# Patient Record
Sex: Male | Born: 1960
Health system: Southern US, Community
[De-identification: ages and names within clinical notes are randomized; demographics above are authoritative.]

## PROBLEM LIST (undated history)

## (undated) DIAGNOSIS — C801 Malignant (primary) neoplasm, unspecified: Secondary | ICD-10-CM

## (undated) DIAGNOSIS — M818 Other osteoporosis without current pathological fracture: Secondary | ICD-10-CM

## (undated) DIAGNOSIS — Z800 Family history of malignant neoplasm of digestive organs: Secondary | ICD-10-CM

## (undated) DIAGNOSIS — M1990 Unspecified osteoarthritis, unspecified site: Secondary | ICD-10-CM

## (undated) DIAGNOSIS — I10 Essential (primary) hypertension: Secondary | ICD-10-CM

## (undated) DIAGNOSIS — Z8042 Family history of malignant neoplasm of prostate: Secondary | ICD-10-CM

## (undated) DIAGNOSIS — Z806 Family history of leukemia: Secondary | ICD-10-CM

## (undated) DIAGNOSIS — N189 Chronic kidney disease, unspecified: Secondary | ICD-10-CM

## (undated) DIAGNOSIS — C50929 Malignant neoplasm of unspecified site of unspecified male breast: Secondary | ICD-10-CM

## (undated) HISTORY — DX: Chronic kidney disease, unspecified: N18.9

## (undated) HISTORY — PX: COLONOSCOPY: SHX174

## (undated) HISTORY — DX: Essential (primary) hypertension: I10

## (undated) HISTORY — DX: Family history of leukemia: Z80.6

## (undated) HISTORY — DX: Malignant neoplasm of unspecified site of unspecified male breast: C50.929

## (undated) HISTORY — PX: BREAST SURGERY: SHX581

## (undated) HISTORY — DX: Malignant (primary) neoplasm, unspecified: C80.1

## (undated) HISTORY — DX: Family history of malignant neoplasm of prostate: Z80.42

## (undated) HISTORY — DX: Family history of malignant neoplasm of digestive organs: Z80.0

## (undated) HISTORY — DX: Other osteoporosis without current pathological fracture: M81.8

---
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
## 2008-12-09 ENCOUNTER — Emergency Department (HOSPITAL_COMMUNITY): Admission: EM | Admit: 2008-12-09 | Discharge: 2008-12-09 | Payer: Self-pay | Admitting: Emergency Medicine

## 2009-01-07 ENCOUNTER — Emergency Department (HOSPITAL_COMMUNITY): Admission: EM | Admit: 2009-01-07 | Discharge: 2009-01-07 | Payer: Self-pay | Admitting: Emergency Medicine

## 2009-04-20 HISTORY — PX: PORT-A-CATH REMOVAL: SHX5289

## 2009-04-20 HISTORY — PX: OTHER SURGICAL HISTORY: SHX169

## 2009-10-08 ENCOUNTER — Ambulatory Visit: Payer: Self-pay | Admitting: Genetic Counselor

## 2009-11-05 ENCOUNTER — Ambulatory Visit (HOSPITAL_COMMUNITY): Admission: RE | Admit: 2009-11-05 | Discharge: 2009-11-05 | Payer: Self-pay | Admitting: General Surgery

## 2009-11-07 ENCOUNTER — Ambulatory Visit: Payer: Self-pay | Admitting: Hematology & Oncology

## 2009-11-22 LAB — CBC WITH DIFFERENTIAL (CANCER CENTER ONLY)
BASO%: 0.8 % (ref 0.0–2.0)
Eosinophils Absolute: 0.2 10*3/uL (ref 0.0–0.5)
HGB: 15.4 g/dL (ref 13.0–17.1)
MCH: 30.9 pg (ref 28.0–33.4)
MONO#: 0.5 10*3/uL (ref 0.1–0.9)
MONO%: 7.5 % (ref 0.0–13.0)
NEUT#: 3.3 10*3/uL (ref 1.5–6.5)
Platelets: 242 10*3/uL (ref 145–400)
RBC: 5 10*6/uL (ref 4.20–5.70)

## 2009-11-23 LAB — VITAMIN D 25 HYDROXY (VIT D DEFICIENCY, FRACTURES): Vit D, 25-Hydroxy: 27 ng/mL — ABNORMAL LOW (ref 30–89)

## 2009-11-23 LAB — COMPREHENSIVE METABOLIC PANEL
AST: 26 U/L (ref 0–37)
Alkaline Phosphatase: 91 U/L (ref 39–117)
BUN: 14 mg/dL (ref 6–23)
CO2: 27 mEq/L (ref 19–32)
Calcium: 9.3 mg/dL (ref 8.4–10.5)
Chloride: 103 mEq/L (ref 96–112)
Creatinine, Ser: 1.35 mg/dL (ref 0.40–1.50)
Glucose, Bld: 85 mg/dL (ref 70–99)
Potassium: 4.4 mEq/L (ref 3.5–5.3)
Sodium: 139 mEq/L (ref 135–145)
Total Protein: 6.6 g/dL (ref 6.0–8.3)

## 2009-11-28 ENCOUNTER — Telehealth (INDEPENDENT_AMBULATORY_CARE_PROVIDER_SITE_OTHER): Payer: Self-pay | Admitting: *Deleted

## 2009-12-02 ENCOUNTER — Ambulatory Visit: Payer: Self-pay

## 2009-12-02 ENCOUNTER — Encounter (HOSPITAL_COMMUNITY): Admission: RE | Admit: 2009-12-02 | Discharge: 2010-01-14 | Payer: Self-pay | Admitting: Hematology & Oncology

## 2009-12-02 ENCOUNTER — Ambulatory Visit: Payer: Self-pay | Admitting: Cardiovascular Disease

## 2009-12-03 ENCOUNTER — Ambulatory Visit (HOSPITAL_COMMUNITY): Admission: RE | Admit: 2009-12-03 | Discharge: 2009-12-03 | Payer: Self-pay | Admitting: Surgery

## 2009-12-19 ENCOUNTER — Ambulatory Visit: Payer: Self-pay | Admitting: Hematology & Oncology

## 2009-12-20 LAB — COMPREHENSIVE METABOLIC PANEL
ALT: 36 U/L (ref 0–53)
AST: 22 U/L (ref 0–37)
Albumin: 4.2 g/dL (ref 3.5–5.2)
Alkaline Phosphatase: 115 U/L (ref 39–117)
BUN: 16 mg/dL (ref 6–23)
CO2: 23 mEq/L (ref 19–32)
Calcium: 9.3 mg/dL (ref 8.4–10.5)
Chloride: 108 mEq/L (ref 96–112)
Creatinine, Ser: 1.21 mg/dL (ref 0.40–1.50)
Glucose, Bld: 108 mg/dL — ABNORMAL HIGH (ref 70–99)
Potassium: 4.2 mEq/L (ref 3.5–5.3)
Total Bilirubin: 0.4 mg/dL (ref 0.3–1.2)
Total Protein: 6.6 g/dL (ref 6.0–8.3)

## 2009-12-20 LAB — CBC WITH DIFFERENTIAL (CANCER CENTER ONLY)
EOS%: 2.6 % (ref 0.0–7.0)
HGB: 15.3 g/dL (ref 13.0–17.1)
LYMPH#: 2.1 10*3/uL (ref 0.9–3.3)
MCH: 29.8 pg (ref 28.0–33.4)
MCV: 87 fL (ref 82–98)
MONO#: 1 10*3/uL — ABNORMAL HIGH (ref 0.1–0.9)
MONO%: 11.6 % (ref 0.0–13.0)
NEUT%: 59.4 % (ref 40.0–80.0)
RDW: 10.8 % (ref 10.5–14.6)

## 2009-12-20 LAB — TECHNOLOGIST REVIEW CHCC SATELLITE

## 2009-12-21 ENCOUNTER — Ambulatory Visit: Payer: Self-pay | Admitting: Hematology & Oncology

## 2010-01-03 LAB — COMPREHENSIVE METABOLIC PANEL
ALT: 57 U/L — ABNORMAL HIGH (ref 0–53)
Calcium: 9.1 mg/dL (ref 8.4–10.5)
Creatinine, Ser: 1.2 mg/dL (ref 0.40–1.50)
Potassium: 4.4 mEq/L (ref 3.5–5.3)
Sodium: 140 mEq/L (ref 135–145)
Total Protein: 6.7 g/dL (ref 6.0–8.3)

## 2010-01-03 LAB — CBC WITH DIFFERENTIAL (CANCER CENTER ONLY)
EOS%: 1.9 % (ref 0.0–7.0)
LYMPH#: 1.8 10*3/uL (ref 0.9–3.3)
MCH: 30.1 pg (ref 28.0–33.4)
MONO#: 1.3 10*3/uL — ABNORMAL HIGH (ref 0.1–0.9)
NEUT#: 4.9 10*3/uL (ref 1.5–6.5)
NEUT%: 59.4 % (ref 40.0–80.0)
RBC: 4.72 10*6/uL (ref 4.20–5.70)

## 2010-01-17 ENCOUNTER — Ambulatory Visit: Payer: Self-pay | Admitting: Hematology & Oncology

## 2010-01-17 LAB — COMPREHENSIVE METABOLIC PANEL
BUN: 12 mg/dL (ref 6–23)
Calcium: 8.8 mg/dL (ref 8.4–10.5)
Chloride: 105 mEq/L (ref 96–112)
Glucose, Bld: 88 mg/dL (ref 70–99)
Potassium: 4 mEq/L (ref 3.5–5.3)
Total Bilirubin: 0.6 mg/dL (ref 0.3–1.2)
Total Protein: 6.4 g/dL (ref 6.0–8.3)

## 2010-01-17 LAB — CBC WITH DIFFERENTIAL (CANCER CENTER ONLY)
BASO#: 0.1 10*3/uL (ref 0.0–0.2)
BASO%: 0.8 % (ref 0.0–2.0)
EOS%: 1.3 % (ref 0.0–7.0)
HCT: 34.1 % — ABNORMAL LOW (ref 38.7–49.9)
HGB: 11.7 g/dL — ABNORMAL LOW (ref 13.0–17.1)
LYMPH#: 1.3 10*3/uL (ref 0.9–3.3)
MCH: 30.3 pg (ref 28.0–33.4)
MONO%: 15.6 % — ABNORMAL HIGH (ref 0.0–13.0)
NEUT#: 6.2 10*3/uL (ref 1.5–6.5)
Platelets: 183 10*3/uL (ref 145–400)
RDW: 11.7 % (ref 10.5–14.6)
WBC: 9.2 10*3/uL (ref 4.0–10.0)

## 2010-02-21 ENCOUNTER — Ambulatory Visit: Payer: Self-pay | Admitting: Hematology & Oncology

## 2010-02-21 LAB — CBC WITH DIFFERENTIAL (CANCER CENTER ONLY)
BASO#: 0 10*3/uL (ref 0.0–0.2)
EOS%: 3.3 % (ref 0.0–7.0)
Eosinophils Absolute: 0.1 10*3/uL (ref 0.0–0.5)
HCT: 36.2 % — ABNORMAL LOW (ref 38.7–49.9)
HGB: 12.7 g/dL — ABNORMAL LOW (ref 13.0–17.1)
LYMPH#: 1.1 10*3/uL (ref 0.9–3.3)
MCHC: 35 g/dL (ref 32.0–35.9)
MONO#: 0.5 10*3/uL (ref 0.1–0.9)
MONO%: 12.6 % (ref 0.0–13.0)
NEUT%: 56.4 % (ref 40.0–80.0)
Platelets: 184 10*3/uL (ref 145–400)
RBC: 3.91 10*6/uL — ABNORMAL LOW (ref 4.20–5.70)
WBC: 4.2 10*3/uL (ref 4.0–10.0)

## 2010-02-21 LAB — COMPREHENSIVE METABOLIC PANEL
ALT: 47 U/L (ref 0–53)
AST: 37 U/L (ref 0–37)
CO2: 24 mEq/L (ref 19–32)
Calcium: 8.9 mg/dL (ref 8.4–10.5)
Chloride: 105 mEq/L (ref 96–112)
Creatinine, Ser: 1.02 mg/dL (ref 0.40–1.50)
Glucose, Bld: 81 mg/dL (ref 70–99)
Sodium: 141 mEq/L (ref 135–145)

## 2010-04-03 ENCOUNTER — Ambulatory Visit: Payer: Self-pay | Admitting: Hematology & Oncology

## 2010-04-04 LAB — CBC WITH DIFFERENTIAL (CANCER CENTER ONLY)
MCHC: 34.5 g/dL (ref 32.0–35.9)
MCV: 92 fL (ref 82–98)
MONO#: 0.6 10*3/uL (ref 0.1–0.9)
NEUT#: 3 10*3/uL (ref 1.5–6.5)
NEUT%: 57.7 % (ref 40.0–80.0)
Platelets: 222 10*3/uL (ref 145–400)
RBC: 4.62 10*6/uL (ref 4.20–5.70)

## 2010-04-05 LAB — COMPREHENSIVE METABOLIC PANEL
ALT: 38 U/L (ref 0–53)
AST: 28 U/L (ref 0–37)
Alkaline Phosphatase: 93 U/L (ref 39–117)
BUN: 15 mg/dL (ref 6–23)
Calcium: 9.6 mg/dL (ref 8.4–10.5)
Potassium: 4.3 mEq/L (ref 3.5–5.3)
Sodium: 139 mEq/L (ref 135–145)
Total Protein: 7 g/dL (ref 6.0–8.3)

## 2010-05-22 NOTE — Progress Notes (Signed)
Summary: Nuc Pre-Procedure  Phone Note Outgoing Call Call back at [facility] Phone [phone]   Call placed by: Antionette Char RN,  November 28, 2009 4:59 PM Call placed to: Patient Reason for Call: Confirm/change Appt Summary of Call: Left message with information on Myoview Information Sheet (see scanned document for details).   New Allergies: ! PENICILLIN ! AMOXICILLIN New Allergies: ! PENICILLIN ! AMOXICILLIN

## 2010-05-22 NOTE — Assessment & Plan Note (Signed)
Summary: Cardiology Nuclear Testing    Muga Study  Indication: Evaluation of LVEF prior to chemotherapy for R breast cancer.  IV started in L hand with 20g by CHasspacher,RN  Muga Information: The patient's red blood cells were labeled using the Ultra Tag method with 33  mci of Technitium-52m Pertechnetate.  The images were reconstructed in the Anterior, Lateral and Left Anterior Oblique Views.  Impression: Images reviewed in the best septal, anterior and lateral projections.  LV cavity size and function were normal.  EF 57%  No RWMA's

## 2010-06-27 ENCOUNTER — Other Ambulatory Visit: Payer: Self-pay | Admitting: Hematology & Oncology

## 2010-06-27 ENCOUNTER — Encounter (HOSPITAL_BASED_OUTPATIENT_CLINIC_OR_DEPARTMENT_OTHER): Payer: 59 | Admitting: Hematology & Oncology

## 2010-06-27 DIAGNOSIS — C50029 Malignant neoplasm of nipple and areola, unspecified male breast: Secondary | ICD-10-CM

## 2010-06-27 LAB — CBC WITH DIFFERENTIAL (CANCER CENTER ONLY)
BASO#: 0.1 10*3/uL (ref 0.0–0.2)
BASO%: 0.9 % (ref 0.0–2.0)
EOS%: 4.4 % (ref 0.0–7.0)
Eosinophils Absolute: 0.3 10*3/uL (ref 0.0–0.5)
HCT: 40.6 % (ref 38.7–49.9)
HCT: 41.6 % (ref 38.7–49.9)
HGB: 15 g/dL (ref 13.0–17.1)
HGB: 15.2 g/dL (ref 13.0–17.1)
LYMPH%: 48.7 % — ABNORMAL HIGH (ref 14.0–48.0)
MCH: 30.2 pg (ref 28.0–33.4)
MCV: 82 fL (ref 82–98)
MCV: 81 fL — ABNORMAL LOW (ref 82–98)
MONO#: 1 10*3/uL — ABNORMAL HIGH (ref 0.1–0.9)
MONO%: 11.1 % (ref 0.0–13.0)
MONO%: 12.8 % (ref 0.0–13.0)
NEUT#: 3.2 10*3/uL (ref 1.5–6.5)
NEUT%: 54.2 % (ref 40.0–80.0)
Platelets: 175 10*3/uL (ref 145–400)
Platelets: 196 10*3/uL (ref 145–400)
RBC: 4.97 10*6/uL (ref 4.20–5.70)
RBC: 5.11 10*6/uL (ref 4.20–5.70)

## 2010-06-27 LAB — COMPREHENSIVE METABOLIC PANEL
AST: 41 U/L — ABNORMAL HIGH (ref 0–37)
Calcium: 9.3 mg/dL (ref 8.4–10.5)
Glucose, Bld: 91 mg/dL (ref 70–99)
Potassium: 4.3 mEq/L (ref 3.5–5.3)
Total Protein: 7 g/dL (ref 6.0–8.3)

## 2010-07-06 LAB — DIFFERENTIAL
Eosinophils Absolute: 0.2 10*3/uL (ref 0.0–0.7)
Eosinophils Relative: 3 % (ref 0–5)
Lymphocytes Relative: 27 % (ref 12–46)
Lymphs Abs: 1.9 10*3/uL (ref 0.7–4.0)

## 2010-07-06 LAB — COMPREHENSIVE METABOLIC PANEL
ALT: 50 U/L (ref 0–53)
BUN: 13 mg/dL (ref 6–23)
CO2: 27 mEq/L (ref 19–32)
Chloride: 104 mEq/L (ref 96–112)
Creatinine, Ser: 1.36 mg/dL (ref 0.4–1.5)
GFR calc Af Amer: >60 mL/min (ref >60)
GFR calc non Af Amer: 56 mL/min — ABNORMAL LOW (ref >60)
Glucose, Bld: 112 mg/dL — ABNORMAL HIGH (ref 70–99)
Potassium: 4.2 mEq/L (ref 3.5–5.1)
Sodium: 138 mEq/L (ref 135–145)
Total Bilirubin: 1 mg/dL (ref 0.3–1.2)
Total Protein: 6.6 g/dL (ref 6.0–8.3)

## 2010-07-06 LAB — URINALYSIS, ROUTINE W REFLEX MICROSCOPIC
Bilirubin Urine: NEGATIVE
Glucose, UA: NEGATIVE mg/dL
Protein, ur: NEGATIVE mg/dL
Specific Gravity, Urine: 1.024 (ref 1.005–1.030)
Urobilinogen, UA: 1 mg/dL (ref 0.0–1.0)
pH: 6 (ref 5.0–8.0)

## 2010-07-06 LAB — CBC
MCHC: 34.8 g/dL (ref 30.0–36.0)
MCV: 88.3 fL (ref 78.0–100.0)
Platelets: 216 10*3/uL (ref 150–400)
RBC: 5.04 MIL/uL (ref 4.22–5.81)
WBC: 6.9 10*3/uL (ref 4.0–10.5)

## 2010-07-06 LAB — SURGICAL PCR SCREEN: MRSA, PCR: NEGATIVE

## 2010-07-25 LAB — URINE MICROSCOPIC-ADD ON

## 2010-07-25 LAB — URINALYSIS, ROUTINE W REFLEX MICROSCOPIC
Bilirubin Urine: NEGATIVE
Glucose, UA: NEGATIVE mg/dL
Leukocytes, UA: NEGATIVE
Nitrite: NEGATIVE
pH: 6 (ref 5.0–8.0)

## 2010-07-25 LAB — DIFFERENTIAL
Monocytes Absolute: 0.5 10*3/uL (ref 0.1–1.0)
Monocytes Relative: 8 % (ref 3–12)
Neutro Abs: 4 10*3/uL (ref 1.7–7.7)

## 2010-07-25 LAB — CBC
HCT: 41.9 % (ref 39.0–52.0)
WBC: 6.7 10*3/uL (ref 4.0–10.5)

## 2010-07-25 LAB — BASIC METABOLIC PANEL
BUN: 14 mg/dL (ref 6–23)
Calcium: 9 mg/dL (ref 8.4–10.5)
GFR calc non Af Amer: 55 mL/min — ABNORMAL LOW (ref >60)
Potassium: 3.6 mEq/L (ref 3.5–5.1)
Sodium: 140 mEq/L (ref 135–145)

## 2010-09-26 ENCOUNTER — Other Ambulatory Visit: Payer: Self-pay | Admitting: Hematology & Oncology

## 2010-09-26 ENCOUNTER — Encounter (HOSPITAL_BASED_OUTPATIENT_CLINIC_OR_DEPARTMENT_OTHER): Payer: 59 | Admitting: Hematology & Oncology

## 2010-09-26 DIAGNOSIS — C50029 Malignant neoplasm of nipple and areola, unspecified male breast: Secondary | ICD-10-CM

## 2010-09-26 LAB — CBC WITH DIFFERENTIAL (CANCER CENTER ONLY)
BASO#: 0.1 10*3/uL (ref 0.0–0.2)
BASO%: 1.2 % (ref 0.0–2.0)
EOS%: 3.5 % (ref 0.0–7.0)
HGB: 16.4 g/dL (ref 13.0–17.1)
LYMPH#: 1.6 10*3/uL (ref 0.9–3.3)
MCHC: 37.5 g/dL — ABNORMAL HIGH (ref 32.0–35.9)
NEUT#: 3.4 10*3/uL (ref 1.5–6.5)
Platelets: 201 10*3/uL (ref 145–400)
RDW: 12.5 % (ref 11.1–15.7)

## 2010-09-26 LAB — VITAMIN D 25 HYDROXY (VIT D DEFICIENCY, FRACTURES): Vit D, 25-Hydroxy: 35 ng/mL (ref 30–89)

## 2010-09-26 LAB — COMPREHENSIVE METABOLIC PANEL
ALT: 69 U/L — ABNORMAL HIGH (ref 0–53)
AST: 41 U/L — ABNORMAL HIGH (ref 0–37)
Albumin: 4.4 g/dL (ref 3.5–5.2)
Calcium: 9.9 mg/dL (ref 8.4–10.5)
Chloride: 107 mEq/L (ref 96–112)
Potassium: 4.2 mEq/L (ref 3.5–5.3)
Sodium: 139 mEq/L (ref 135–145)
Total Protein: 7.3 g/dL (ref 6.0–8.3)

## 2011-01-02 ENCOUNTER — Other Ambulatory Visit: Payer: Self-pay | Admitting: Hematology & Oncology

## 2011-01-02 ENCOUNTER — Encounter (HOSPITAL_BASED_OUTPATIENT_CLINIC_OR_DEPARTMENT_OTHER): Payer: 59 | Admitting: Hematology & Oncology

## 2011-01-02 DIAGNOSIS — C50029 Malignant neoplasm of nipple and areola, unspecified male breast: Secondary | ICD-10-CM

## 2011-01-02 LAB — CBC WITH DIFFERENTIAL (CANCER CENTER ONLY)
BASO%: 0.7 % (ref 0.0–2.0)
EOS%: 5.9 % (ref 0.0–7.0)
HCT: 44.7 % (ref 38.7–49.9)
LYMPH#: 1.6 10*3/uL (ref 0.9–3.3)
MCHC: 38.5 g/dL — ABNORMAL HIGH (ref 32.0–35.9)
MONO#: 0.6 10*3/uL (ref 0.1–0.9)
NEUT#: 2.9 10*3/uL (ref 1.5–6.5)
Platelets: 199 10*3/uL (ref 145–400)
RDW: 12.5 % (ref 11.1–15.7)
WBC: 5.4 10*3/uL (ref 4.0–10.0)

## 2011-01-03 LAB — COMPREHENSIVE METABOLIC PANEL
Alkaline Phosphatase: 84 U/L (ref 39–117)
BUN: 16 mg/dL (ref 6–23)
Chloride: 106 mEq/L (ref 96–112)
Glucose, Bld: 106 mg/dL — ABNORMAL HIGH (ref 70–99)
Potassium: 4.2 mEq/L (ref 3.5–5.3)
Sodium: 142 mEq/L (ref 135–145)
Total Bilirubin: 1 mg/dL (ref 0.3–1.2)

## 2011-02-27 ENCOUNTER — Telehealth: Payer: Self-pay | Admitting: Hematology & Oncology

## 2011-02-27 NOTE — Telephone Encounter (Signed)
Pt moved 12-14 to 12-21

## 2011-03-23 ENCOUNTER — Other Ambulatory Visit: Payer: Self-pay | Admitting: *Deleted

## 2011-03-23 DIAGNOSIS — C50119 Malignant neoplasm of central portion of unspecified female breast: Secondary | ICD-10-CM

## 2011-03-23 MED ORDER — LETROZOLE 2.5 MG PO TABS: 2.5000 mg | ORAL_TABLET | Freq: Every day | ORAL | Status: DC

## 2011-04-03 ENCOUNTER — Other Ambulatory Visit: Payer: 59 | Admitting: Lab

## 2011-04-03 ENCOUNTER — Ambulatory Visit: Payer: 59 | Admitting: Hematology & Oncology

## 2011-04-10 ENCOUNTER — Ambulatory Visit (HOSPITAL_BASED_OUTPATIENT_CLINIC_OR_DEPARTMENT_OTHER): Payer: 59 | Admitting: Hematology & Oncology

## 2011-04-10 ENCOUNTER — Other Ambulatory Visit (HOSPITAL_BASED_OUTPATIENT_CLINIC_OR_DEPARTMENT_OTHER): Payer: 59 | Admitting: Lab

## 2011-04-10 ENCOUNTER — Other Ambulatory Visit: Payer: Self-pay | Admitting: Hematology & Oncology

## 2011-04-10 VITALS — BP 147/95 | HR 92 | Temp 98.7°F | Ht 74.0 in | Wt 264.0 lb

## 2011-04-10 DIAGNOSIS — C50029 Malignant neoplasm of nipple and areola, unspecified male breast: Secondary | ICD-10-CM

## 2011-04-10 DIAGNOSIS — E559 Vitamin D deficiency, unspecified: Secondary | ICD-10-CM

## 2011-04-10 DIAGNOSIS — C50119 Malignant neoplasm of central portion of unspecified female breast: Secondary | ICD-10-CM

## 2011-04-10 LAB — CBC WITH DIFFERENTIAL (CANCER CENTER ONLY)
BASO#: 0.1 10*3/uL (ref 0.0–0.2)
BASO%: 1.2 % (ref 0.0–2.0)
EOS%: 3.5 % (ref 0.0–7.0)
Eosinophils Absolute: 0.2 10*3/uL (ref 0.0–0.5)
HCT: 45.1 % (ref 38.7–49.9)
HGB: 16.8 g/dL (ref 13.0–17.1)
LYMPH#: 2 10*3/uL (ref 0.9–3.3)
LYMPH%: 28.7 % (ref 14.0–48.0)
MCH: 30.9 pg (ref 28.0–33.4)
MCHC: 37.3 g/dL — ABNORMAL HIGH (ref 32.0–35.9)
MCV: 83 fL (ref 82–98)
MONO#: 0.9 10*3/uL (ref 0.1–0.9)
MONO%: 13 % (ref 0.0–13.0)
NEUT#: 3.7 10*3/uL (ref 1.5–6.5)
NEUT%: 53.6 % (ref 40.0–80.0)
Platelets: 199 10*3/uL (ref 145–400)
RBC: 5.43 10*6/uL (ref 4.20–5.70)
RDW: 12.9 % (ref 11.1–15.7)
WBC: 6.8 10*3/uL (ref 4.0–10.0)

## 2011-04-10 LAB — COMPREHENSIVE METABOLIC PANEL
ALT: 72 U/L — ABNORMAL HIGH (ref 0–53)
AST: 41 U/L — ABNORMAL HIGH (ref 0–37)
Albumin: 4.5 g/dL (ref 3.5–5.2)
Alkaline Phosphatase: 89 U/L (ref 39–117)
Calcium: 9.7 mg/dL (ref 8.4–10.5)
Chloride: 107 mEq/L (ref 96–112)
Potassium: 4.2 mEq/L (ref 3.5–5.3)
Sodium: 142 mEq/L (ref 135–145)
Total Protein: 7.1 g/dL (ref 6.0–8.3)

## 2011-04-10 NOTE — Progress Notes (Signed)
CC:   Ralph Buck. Ralph Buck, M.D. Ralph Ruths, MD Ralph Buck, Ralph Buck  DIAGNOSIS:  Stage I (T1c N0 M0) ductal carcinoma of the right breast.  CURRENT THERAPY:  Femara 2.5 mg p.o. daily.  INTERVAL HISTORY:  Ralph Buck comes in for followup.  He is doing well. He is  working without problems.  His 2 daughters are playing basketball. One is in college, one is in high school.  He has had no problems with fatigue or weakness.  He has noticed some more lipomas.  I told him that these would not be a problem.  He has not had any change in bowel or bladder habits.  He is trying to exercise more.  He has gained a little weight, which he is going to try to lose next year. Apparently, he has had no problems with arm swelling with the right arm. He has had no dysphasia or odynophagia.  There has been no headache.  He has had no cough.  PHYSICAL EXAM:  General: This is a well-developed, well-nourished, white gentleman in no obvious distress.  Vital signs: 98.7, pulse 92, respiratory rate 20, blood pressure 142/95, and weight is 264.  Head and neck exam shows a normocephalic, atraumatic skull.  There are no ocular or oral lesions.  There are no palpable cervical or supraclavicular lymph nodes.  Lungs are clear to percussion and auscultation bilaterally.  Cardiac examination:  Regular rhythm with a normal S1 and S2.  There are no murmurs, rubs, or bruits.  Breast exam: Shows left breast no masses, edema, or erythema.  There is no left axillary adenopathy.  Right chest wall: Shows well-healed mastectomy.  No right chest wall nodules were noted.  There is no right chest wall swelling. There is no right axillary adenopathy.  Abdominal exam: Soft, mildly obese.  He has good bowel sounds.  There is no fluid wave.  There is no palpable hepatosplenomegaly.  Back exam:  No tenderness over the spine, ribs, or hips.  Extremities: Shows no clubbing, cyanosis, or edema. Neurological exam: Shows no focal  neurological deficits.  Skin exam: No rashes, ecchymosis, or petechia.  LABORATORY STUDIES:  White cell count 6.8, hemoglobin 16.8, hematocrit 45.1, and platelet count 199.  IMPRESSION:  Ralph Buck is a 50 year old gentleman with male breast cancer.  He has stage I disease.  He underwent 4 cycles of dose-dense Adriamycin/Cytoxan.  He completed this in October 2011.  He is on Femara right now.  There is no need for radiation therapy.  I forgot to mention that he is taking vitamin D.  The vitamin D will be helpful to help minimize any arthritis from the Femara.  We will go ahead and plan to get him back in 4 months now.  I do not see a need to do any lab work or x-rays in between visits.    ______________________________ Ralph Buck, M.D. PRE/MEDQ  D:  04/10/2011  T:  04/10/2011  Job:  [id number]

## 2011-04-10 NOTE — Progress Notes (Signed)
This office note has been dictated.

## 2011-04-11 LAB — COMPREHENSIVE METABOLIC PANEL
ALT: 72 U/L — ABNORMAL HIGH (ref 0–53)
AST: 41 U/L — ABNORMAL HIGH (ref 0–37)
AST: 41 U/L — ABNORMAL HIGH (ref 0–37)
Albumin: 4.5 g/dL (ref 3.5–5.2)
Alkaline Phosphatase: 89 U/L (ref 39–117)
BUN: 16 mg/dL (ref 6–23)
BUN: 16 mg/dL (ref 6–23)
CO2: 23 mEq/L (ref 19–32)
Calcium: 9.7 mg/dL (ref 8.4–10.5)
Calcium: 9.7 mg/dL (ref 8.4–10.5)
Chloride: 107 mEq/L (ref 96–112)
Creatinine, Ser: 1.36 mg/dL — ABNORMAL HIGH (ref 0.50–1.35)
Glucose, Bld: 101 mg/dL — ABNORMAL HIGH (ref 70–99)
Potassium: 4.2 mEq/L (ref 3.5–5.3)
Total Bilirubin: 0.8 mg/dL (ref 0.3–1.2)

## 2011-04-11 LAB — VITAMIN D 25 HYDROXY (VIT D DEFICIENCY, FRACTURES): Vit D, 25-Hydroxy: 45 ng/mL (ref 30–89)

## 2011-05-14 ENCOUNTER — Encounter (INDEPENDENT_AMBULATORY_CARE_PROVIDER_SITE_OTHER): Payer: Self-pay | Admitting: General Surgery

## 2011-06-09 IMAGING — CR DG CHEST 1V PORT
1 series · 1 of 1 positions shown · non-contrast
Comparison: October 30, 2009

CLINICAL DATA: Breast cancer; Port-A-Cath placement

PORTABLE CHEST - 1 VIEW

[series [date]]
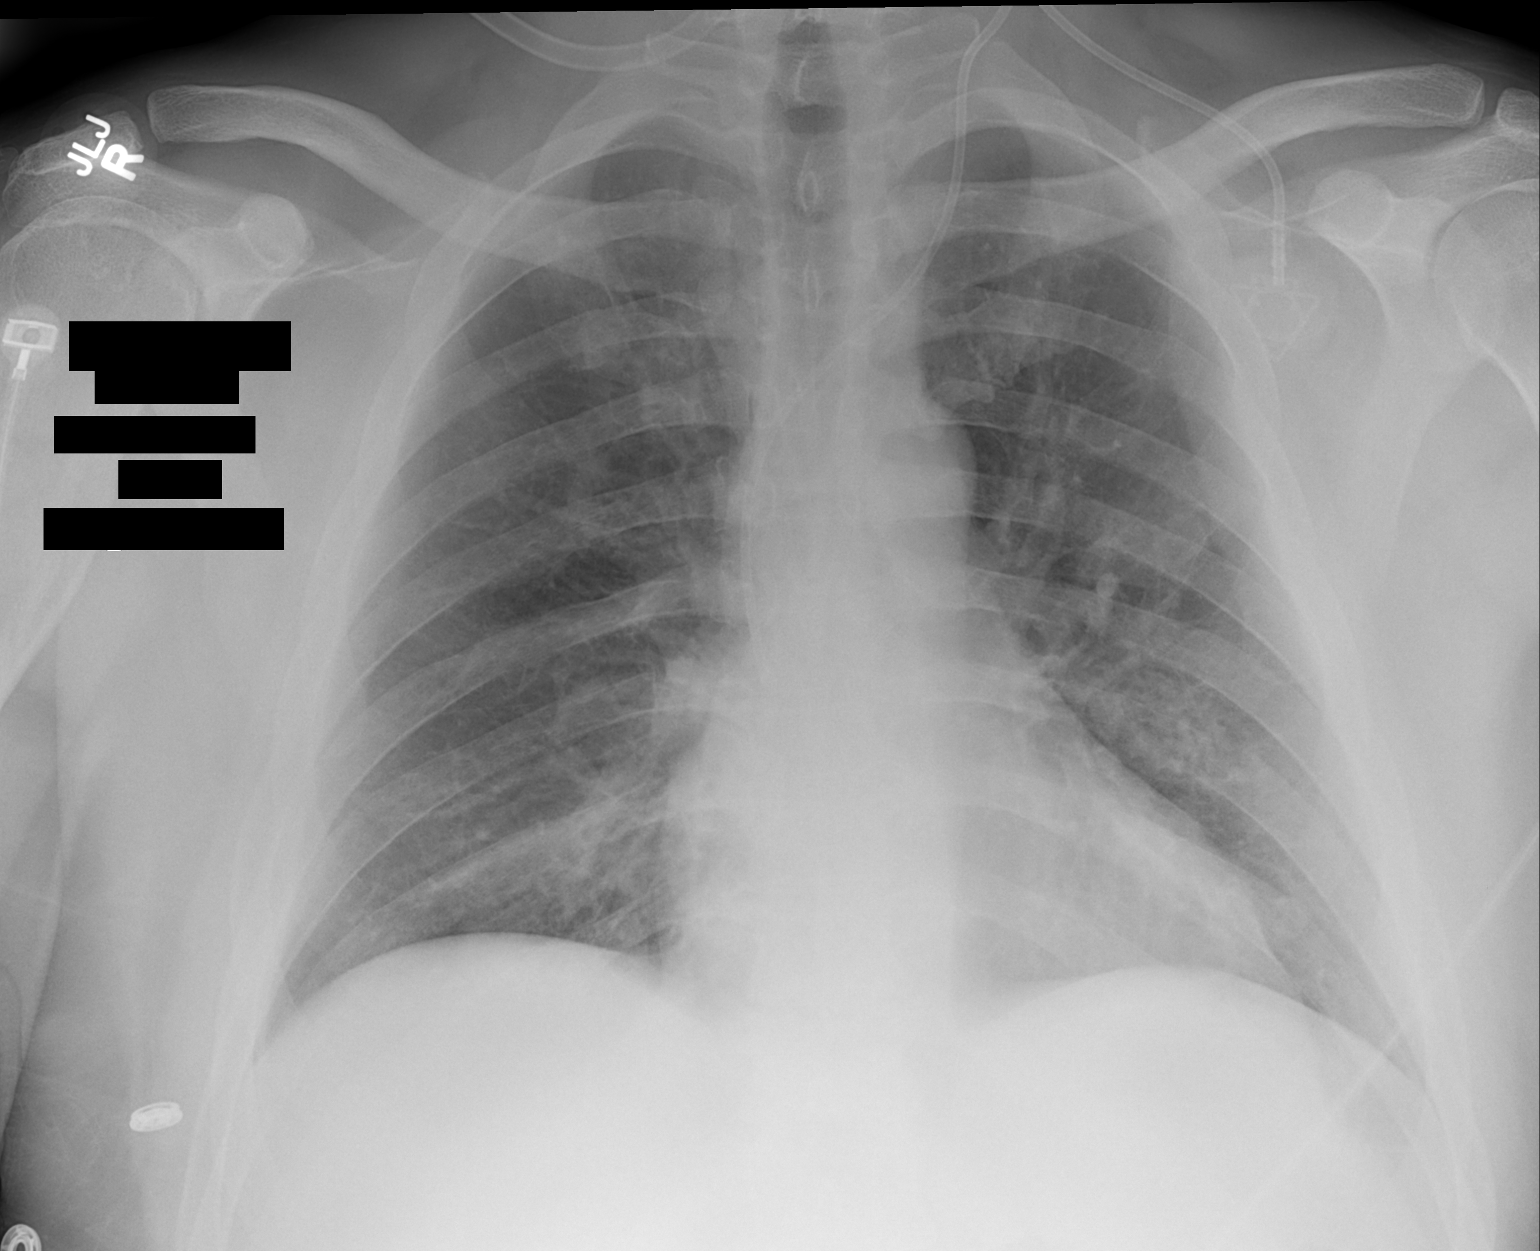

[1 of 1 positions shown; findings below may reference images not displayed]

FINDINGS: The cardiac silhouette, mediastinum, pulmonary
vasculature are within normal limits.  Both lungs are clear.  The
left central line tip is at the cavoatrial junction.  No
pneumothorax. There is no acute bony abnormality.
IMPRESSION: Central line tip at cavoatrial junction.  No pneumothorax.

## 2011-07-10 ENCOUNTER — Ambulatory Visit (INDEPENDENT_AMBULATORY_CARE_PROVIDER_SITE_OTHER): Payer: Self-pay | Admitting: General Surgery

## 2011-07-31 ENCOUNTER — Encounter (INDEPENDENT_AMBULATORY_CARE_PROVIDER_SITE_OTHER): Payer: Self-pay | Admitting: General Surgery

## 2011-07-31 ENCOUNTER — Ambulatory Visit (INDEPENDENT_AMBULATORY_CARE_PROVIDER_SITE_OTHER): Payer: 59 | Admitting: General Surgery

## 2011-07-31 VITALS — BP 132/94 | HR 68 | Temp 97.2°F | Resp 18 | Ht 74.0 in | Wt 267.0 lb

## 2011-07-31 DIAGNOSIS — C50929 Malignant neoplasm of unspecified site of unspecified male breast: Secondary | ICD-10-CM

## 2011-07-31 DIAGNOSIS — C50119 Malignant neoplasm of central portion of unspecified female breast: Secondary | ICD-10-CM

## 2011-07-31 NOTE — Progress Notes (Signed)
Chief complaint: Followup breast cancer  History: Patient returns for long-term followup of his T1 C. N0 M0 invasive ductal carcinoma right breast status post right total mastectomy with negative sentinel lymph node biopsy in July 2011. He was ER/PR positive and is on Femara and followed by Dr. Myna Hidalgo. He reports he is doing well. He denies any chest wall or skin changes, arm swelling, and usual pain or other complaints.  Exam: BP 132/94  Pulse 68  Temp(Src) 97.2 F (36.2 C) (Temporal)  Resp 18  Ht 6\' 2"  (1.88 m)  Wt 267 lb (121.11 kg)  BMI 34.28 kg/m2 General: Overweight but otherwise well-appearing Caucasian male Skin: Warm and dry no rash or infection Lymph nodes: No palpable cervical, supraclavicular, or axillary nodes Lungs: Clear and equal breath sounds Breasts: Well-healed mastectomy on the right with no skin or chest wall masses detectable. Axilla negative. Left breast is negative to exam  Assessment and plan: Doing well with no evidence of recurrence or complication from treatment. I asked him to return in 6 months

## 2011-08-06 ENCOUNTER — Other Ambulatory Visit (HOSPITAL_BASED_OUTPATIENT_CLINIC_OR_DEPARTMENT_OTHER): Payer: 59 | Admitting: Lab

## 2011-08-06 ENCOUNTER — Ambulatory Visit (HOSPITAL_BASED_OUTPATIENT_CLINIC_OR_DEPARTMENT_OTHER): Payer: 59 | Admitting: Hematology & Oncology

## 2011-08-06 VITALS — BP 130/85 | HR 102 | Temp 97.3°F | Ht 73.0 in | Wt 265.0 lb

## 2011-08-06 DIAGNOSIS — Z170 Estrogen receptor positive status [ER+]: Secondary | ICD-10-CM

## 2011-08-06 DIAGNOSIS — M810 Age-related osteoporosis without current pathological fracture: Secondary | ICD-10-CM

## 2011-08-06 DIAGNOSIS — C50029 Malignant neoplasm of nipple and areola, unspecified male breast: Secondary | ICD-10-CM

## 2011-08-06 DIAGNOSIS — C50119 Malignant neoplasm of central portion of unspecified female breast: Secondary | ICD-10-CM

## 2011-08-06 LAB — CBC WITH DIFFERENTIAL (CANCER CENTER ONLY)
BASO#: 0.1 10*3/uL (ref 0.0–0.2)
BASO%: 1.4 % (ref 0.0–2.0)
EOS%: 2.5 % (ref 0.0–7.0)
HCT: 46.5 % (ref 38.7–49.9)
HGB: 17.4 g/dL — ABNORMAL HIGH (ref 13.0–17.1)
LYMPH#: 2.1 10*3/uL (ref 0.9–3.3)
LYMPH%: 33 % (ref 14.0–48.0)
MCH: 31.1 pg (ref 28.0–33.4)
MCHC: 37.4 g/dL — ABNORMAL HIGH (ref 32.0–35.9)
MCV: 83 fL (ref 82–98)
MONO%: 9.2 % (ref 0.0–13.0)
NEUT%: 53.9 % (ref 40.0–80.0)
RDW: 12.7 % (ref 11.1–15.7)

## 2011-08-06 NOTE — Progress Notes (Signed)
This office note has been dictated.

## 2011-08-07 LAB — COMPREHENSIVE METABOLIC PANEL
AST: 52 U/L — ABNORMAL HIGH (ref 0–37)
Albumin: 4.6 g/dL (ref 3.5–5.2)
Alkaline Phosphatase: 88 U/L (ref 39–117)
BUN: 15 mg/dL (ref 6–23)
Creatinine, Ser: 1.39 mg/dL — ABNORMAL HIGH (ref 0.50–1.35)
Glucose, Bld: 136 mg/dL — ABNORMAL HIGH (ref 70–99)
Potassium: 3.8 mEq/L (ref 3.5–5.3)

## 2011-08-07 NOTE — Progress Notes (Signed)
CC:   Lorne Skeens. Hoxworth, M.D. Laurell Josephs, [place]  DIAGNOSIS:  Stage I (T1c N0 M0) ductal carcinoma of the right breast (male breast cancer).  CURRENT THERAPY:  Femara 2.5 mg p.o. daily.  INTERVAL HISTORY:  Mr. Hevener comes in for his followup.  He is doing well.  It will be 2 years in October that he has completed treatment.  He has had no issues with the Femara.  He does take vitamin D.  He is working without difficulties.  He has had no arthralgias or myalgias. He is trying to lose some weight.  He is hopefully going to exercise a little more with the warmer weather coming.  He is going to make sure he is more fastidious with his diet.  He has had no headache.  He has had no dysphasia or odynophagia.  There has been no leg swelling.  He has had no rashes.  PHYSICAL EXAMINATION:  This is a well-developed, well-nourished white gentleman in no obvious distress.  Vital signs:  Temperature of 97.3, pulse 102, respiratory rate 18, blood pressure 130/85.  Weight is 265. Head and neck exam shows a normocephalic, atraumatic skull.  There are no ocular or oral lesions.  There are no palpable cervical or supraclavicular lymph nodes.  Lungs:  Clear bilaterally.  Cardiac: Regular rate and rhythm with a normal S1 and S2.  There are no murmurs, rubs or bruits.  Abdomen:  Soft with good bowel sounds.  There is no palpable abdominal mass.  There is no fluid wave.  There is no palpable hepatosplenomegaly.  Breasts:  Left breast with no masses, edema or erythema.  There is no left axillary adenopathy. Right breast shows the mastectomy.  There is a well-healed mastectomy scar.  No obvious erythema or nodularity is noted about the right breast tissue.  There is no right axillary adenopathy.  Back: No tenderness of the spine, ribs, or hips.  Extremities:  No clubbing, cyanosis or edema.  Neurologic: No focal neurological deficits.  LABORATORY STUDIES:  White cell count 6.3, hemoglobin 17.4,  hematocrit 46.5, platelet count 187.  IMPRESSION:  Mr. Kerth is a 51 year old gentleman with stage I male breast cancer.  He did undergo adjuvant chemotherapy with 4 cycles of dose-dense Adriamycin/Cytoxan.  He is on Femara.  His cancer is ER positive.  I did recommend that he take a baby aspirin.  I do believe that this will be helpful for him.  Will go ahead and plan to get him back in another 4 months.  Once we get to the end of this year, then we go every 6 months.    ______________________________ Josph Macho, M.D. PRE/MEDQ  D:  08/06/2011  T:  08/07/2011  Job:  [id number]

## 2011-12-24 ENCOUNTER — Other Ambulatory Visit (HOSPITAL_BASED_OUTPATIENT_CLINIC_OR_DEPARTMENT_OTHER): Payer: 59 | Admitting: Lab

## 2011-12-24 ENCOUNTER — Ambulatory Visit (HOSPITAL_BASED_OUTPATIENT_CLINIC_OR_DEPARTMENT_OTHER): Payer: 59 | Admitting: Hematology & Oncology

## 2011-12-24 VITALS — BP 141/95 | HR 100 | Temp 98.0°F | Resp 22 | Ht 72.0 in | Wt 263.0 lb

## 2011-12-24 DIAGNOSIS — C50119 Malignant neoplasm of central portion of unspecified female breast: Secondary | ICD-10-CM

## 2011-12-24 DIAGNOSIS — M818 Other osteoporosis without current pathological fracture: Secondary | ICD-10-CM

## 2011-12-24 DIAGNOSIS — M810 Age-related osteoporosis without current pathological fracture: Secondary | ICD-10-CM

## 2011-12-24 DIAGNOSIS — C50029 Malignant neoplasm of nipple and areola, unspecified male breast: Secondary | ICD-10-CM

## 2011-12-24 DIAGNOSIS — Z170 Estrogen receptor positive status [ER+]: Secondary | ICD-10-CM

## 2011-12-24 LAB — CBC WITH DIFFERENTIAL (CANCER CENTER ONLY)
BASO%: 0.9 % (ref 0.0–2.0)
HCT: 46 % (ref 38.7–49.9)
LYMPH%: 27.6 % (ref 14.0–48.0)
MCV: 83 fL (ref 82–98)
MONO#: 0.6 10*3/uL (ref 0.1–0.9)
NEUT%: 59.8 % (ref 40.0–80.0)
RDW: 12.9 % (ref 11.1–15.7)
WBC: 6.6 10*3/uL (ref 4.0–10.0)

## 2011-12-24 NOTE — Progress Notes (Signed)
This office note has been dictated.

## 2011-12-24 NOTE — Patient Instructions (Signed)
Call with any problems

## 2011-12-25 LAB — COMPREHENSIVE METABOLIC PANEL
ALT: 86 U/L — ABNORMAL HIGH (ref 0–53)
CO2: 24 mEq/L (ref 19–32)
Creatinine, Ser: 1.42 mg/dL — ABNORMAL HIGH (ref 0.50–1.35)
Glucose, Bld: 137 mg/dL — ABNORMAL HIGH (ref 70–99)
Total Bilirubin: 1.2 mg/dL (ref 0.3–1.2)

## 2011-12-25 LAB — VITAMIN D 25 HYDROXY (VIT D DEFICIENCY, FRACTURES): Vit D, 25-Hydroxy: 48 ng/mL (ref 30–89)

## 2011-12-25 LAB — LACTATE DEHYDROGENASE: LDH: 226 U/L (ref 94–250)

## 2011-12-25 NOTE — Progress Notes (Signed)
CC:   Laurell Josephs, PA Lorne Skeens. Hoxworth, M.D.  DIAGNOSIS:  Stage I (T1c N0 M0) ductal carcinoma of the right breast (male breast cancer).  CURRENT THERAPY:  Femara 2.5 mg p.o. daily.  INTERIM HISTORY:  Mr. Delcarlo comes in for followup.  He has had a good summer.  Unfortunately, his job is just very stressful.  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  He is doing well on the Femara.  He has had no arthralgias or myalgias.  He has not noted any abdominal pain.  There has been no change in bowel or bladder habits.  Of note, he completed his chemotherapy back in October 2011.  He has not noted any cough.  He has had no rashes.  There has been no leg swelling.  PHYSICAL EXAMINATION:  This is a well-developed, well-nourished white gentleman in no obvious distress.  Vital signs:  Temperature of 98, pulse 100, respiratory rate 22, blood pressure 141/95.  Weight is 263. Head and neck:  Normocephalic, atraumatic skull.  There are no ocular or oral lesions.  There are no palpable cervical or supraclavicular lymph nodes.  Lungs:  Clear bilaterally.  Cardiac:  Regular rate and rhythm with a normal S1 and S2.  There are no murmurs, rubs or bruits. Abdomen:  Soft with good bowel sounds.  There is no palpable abdominal mass.  There is no palpable hepatosplenomegaly.  Breasts:  Left breast with no mass.  There is no left axillary adenopathy.  Right breast shows the right mastectomy.  The right mastectomy is well-healed.  There is no right breast mass.  There is no right axillary adenopathy.  Back:  No tenderness over the spine, ribs, or hips.  Extremities:  No clubbing, cyanosis or edema.  Skin:  No rashes, ecchymoses or petechia.  LABORATORY STUDIES:  White cell count is 6.6, hemoglobin 17.2, hematocrit 46, platelet count 190.  IMPRESSION:  Mr. Wainer is a 51 year old gentleman with history of stage I infiltrating ductal carcinoma of  the right breast.  He underwent mastectomy.  His tumor was ER positive.  We did go ahead and give him adjuvant chemotherapy with 4 cycles of dose-dense Adriamycin/Cytoxan. Again, this was completed in October 2011.  He did not require any radiation.  We will go ahead and plan to get him back in 4 more months.  I think that after his next visit, we can then move his appointments every 6 months.  I do not see need for any x-ray studies right now.    ______________________________ Josph Macho, M.D. PRE/MEDQ  D:  12/24/2011  T:  12/25/2011  Job:  [id number]

## 2011-12-28 ENCOUNTER — Telehealth: Payer: Self-pay | Admitting: *Deleted

## 2011-12-28 NOTE — Telephone Encounter (Signed)
Called patient to let him know that his labwork looked good per dr. Lupita Leash

## 2011-12-28 NOTE — Telephone Encounter (Signed)
Message copied by Anselm Jungling on Mon Dec 28, 2011  9:39 AM ------      Message from: Ralph Buck      Created: Sun Dec 27, 2011  7:53 PM       Cal - labs look good!!  Cindee Lame

## 2012-01-22 ENCOUNTER — Encounter (INDEPENDENT_AMBULATORY_CARE_PROVIDER_SITE_OTHER): Payer: Self-pay | Admitting: General Surgery

## 2012-01-22 ENCOUNTER — Ambulatory Visit (INDEPENDENT_AMBULATORY_CARE_PROVIDER_SITE_OTHER): Payer: 59 | Admitting: General Surgery

## 2012-01-22 VITALS — BP 152/96 | HR 92 | Temp 98.0°F | Resp 16 | Ht 74.0 in | Wt 265.2 lb

## 2012-01-22 DIAGNOSIS — C50929 Malignant neoplasm of unspecified site of unspecified male breast: Secondary | ICD-10-CM

## 2012-01-22 DIAGNOSIS — C50119 Malignant neoplasm of central portion of unspecified female breast: Secondary | ICD-10-CM

## 2012-01-22 NOTE — Progress Notes (Signed)
Chief complaint: Followup cancer of the breast  History: This returns for followup now over 2 years following treatment for T1 C. N0 cancer of the right breast status post right mastectomy and sentinel lymph node biopsy, chemotherapy and now on letrozole. He notices some subcutaneous lumps along his chest wall history of lipomas. These are unchanged. No lumps around his mastectomy site or left breast. No unusual pain or other concerns.  Exam: Gen.: Moderately overweight, no acute distress Skin: Warm and dry the rash infection Lymph nodes: No palpable cervical, subclavicular or axillary nodes Chest wall: On the lateral left chest wall is approximately 1 cm rounded rubbery freely movable mass entirely consistent with lipoma. Breasts: Well-healed incision on the right without skin changes or palpable masses or axillary abnormalities. Both breasts negative exam.  Assessment and plan: Doing well with no clinical evidence of recurrent breast cancer. He has stable lipomas that do not need intervention at this time. Return in 6 months.

## 2012-04-12 ENCOUNTER — Encounter: Payer: Self-pay | Admitting: Hematology & Oncology

## 2012-04-12 DIAGNOSIS — C50929 Malignant neoplasm of unspecified site of unspecified male breast: Secondary | ICD-10-CM

## 2012-04-12 DIAGNOSIS — C50521 Malignant neoplasm of lower-outer quadrant of right male breast: Secondary | ICD-10-CM | POA: Insufficient documentation

## 2012-04-12 DIAGNOSIS — M818 Other osteoporosis without current pathological fracture: Secondary | ICD-10-CM

## 2012-04-12 HISTORY — DX: Malignant neoplasm of unspecified site of unspecified male breast: C50.929

## 2012-04-12 HISTORY — DX: Other osteoporosis without current pathological fracture: M81.8

## 2012-04-19 ENCOUNTER — Other Ambulatory Visit: Payer: Self-pay | Admitting: *Deleted

## 2012-04-19 DIAGNOSIS — C50919 Malignant neoplasm of unspecified site of unspecified female breast: Secondary | ICD-10-CM

## 2012-04-19 MED ORDER — LETROZOLE 2.5 MG PO TABS: 2.5000 mg | ORAL_TABLET | Freq: Every day | ORAL | Status: DC

## 2012-04-21 ENCOUNTER — Other Ambulatory Visit: Payer: Self-pay | Admitting: *Deleted

## 2012-04-21 DIAGNOSIS — C50919 Malignant neoplasm of unspecified site of unspecified female breast: Secondary | ICD-10-CM

## 2012-04-21 MED ORDER — LETROZOLE 2.5 MG PO TABS: 2.5000 mg | ORAL_TABLET | Freq: Every day | ORAL | Status: DC

## 2012-04-21 NOTE — Telephone Encounter (Signed)
Received refill request from [facility] for letrozole 2.5 mg. An rx was printed on 04/19/12 but had to be redone because it was for 1 tab. Re-issued rx for 30 tabs and sent [facility] to [facility] as this is a chronic med for the pt.

## 2012-04-25 ENCOUNTER — Ambulatory Visit (HOSPITAL_BASED_OUTPATIENT_CLINIC_OR_DEPARTMENT_OTHER): Payer: 59 | Admitting: Hematology & Oncology

## 2012-04-25 ENCOUNTER — Other Ambulatory Visit (HOSPITAL_BASED_OUTPATIENT_CLINIC_OR_DEPARTMENT_OTHER): Payer: 59 | Admitting: Lab

## 2012-04-25 VITALS — BP 131/90 | HR 94 | Temp 98.5°F | Resp 18 | Ht 74.0 in | Wt 263.0 lb

## 2012-04-25 DIAGNOSIS — C50929 Malignant neoplasm of unspecified site of unspecified male breast: Secondary | ICD-10-CM

## 2012-04-25 DIAGNOSIS — C50119 Malignant neoplasm of central portion of unspecified female breast: Secondary | ICD-10-CM

## 2012-04-25 DIAGNOSIS — M818 Other osteoporosis without current pathological fracture: Secondary | ICD-10-CM

## 2012-04-25 DIAGNOSIS — C50029 Malignant neoplasm of nipple and areola, unspecified male breast: Secondary | ICD-10-CM

## 2012-04-25 DIAGNOSIS — Z170 Estrogen receptor positive status [ER+]: Secondary | ICD-10-CM

## 2012-04-25 LAB — CBC WITH DIFFERENTIAL (CANCER CENTER ONLY)
BASO#: 0.1 10*3/uL (ref 0.0–0.2)
Eosinophils Absolute: 0.2 10*3/uL (ref 0.0–0.5)
HGB: 16.9 g/dL (ref 13.0–17.1)
LYMPH#: 1.7 10*3/uL (ref 0.9–3.3)
MONO#: 0.5 10*3/uL (ref 0.1–0.9)
NEUT#: 3.3 10*3/uL (ref 1.5–6.5)
RBC: 5.5 10*6/uL (ref 4.20–5.70)
WBC: 5.8 10*3/uL (ref 4.0–10.0)

## 2012-04-25 NOTE — Progress Notes (Signed)
This office note has been dictated.

## 2012-04-26 LAB — COMPREHENSIVE METABOLIC PANEL
Albumin: 4.6 g/dL (ref 3.5–5.2)
BUN: 15 mg/dL (ref 6–23)
CO2: 26 mEq/L (ref 19–32)
Calcium: 9.7 mg/dL (ref 8.4–10.5)
Chloride: 107 mEq/L (ref 96–112)
Creatinine, Ser: 1.29 mg/dL (ref 0.50–1.35)
Potassium: 4.2 mEq/L (ref 3.5–5.3)

## 2012-04-26 LAB — VITAMIN D 25 HYDROXY (VIT D DEFICIENCY, FRACTURES): Vit D, 25-Hydroxy: 42 ng/mL (ref 30–89)

## 2012-04-26 NOTE — Progress Notes (Signed)
CC:   Ralph Buck, M.D. Ralph Buck, M.D.  DIAGNOSIS:  Stage I (T1c N0 M0) ductal carcinoma of the right breast, male breast cancer.  CURRENT THERAPY:  Femara 2.5 mg p.o. daily.  INTERIM HISTORY:  Ralph Buck comes in for his followup.  We see him every 4 months.  He is doing well.  He had no problems over the Thanksgiving and Christmas holidays.  He lost some weight but put some back on.  His 2 daughters are doing well in college.  They both have scholarships to college for basketball.  As such, this is a huge relief for he and his family financially.  He has had no bony pain.  He has had no change in bowel or bladder habits.  He has had no cough or shortness breath.  He denies any kind of hot flashes or sweats.  PHYSICAL EXAMINATION:  General:  This is a well-developed, well- nourished white gentleman in no obvious distress.  Vital signs:  Show a temperature of 98.5, pulse 94, respiratory rate 18, blood pressure 131/90.  Weight is 263.  Head and neck:  Shows a normocephalic, atraumatic skull.  There are no ocular or oral lesions.  There are no palpable cervical or supraclavicular lymph nodes.  Lungs:  Clear bilaterally.  Cardiac:  Regular rate and rhythm with a normal S1 and S2. There are no murmurs, rubs or bruits.  Breasts:  Shows left breast with no masses, edema or erythema.  There is no left axillary adenopathy. Right chest wall shows well-healed mastectomy.  No right chest wall nodules are noted.  He has no swelling or erythema of the right chest wall. There is no right axillary adenopathy.  Abdomen:  Soft with good bowel sounds.  There is no fluid wave.  There are no palpable abdominal masses noted.  There is no hepatosplenomegaly.  Extremities:  Show no clubbing, cyanosis or edema.  Back:  No tenderness of the spine, ribs or hips.  Neurologic:  No focal neurological deficits.  Skin:  No rashes, ecchymoses or petechiae.  LABORATORY STUDIES:  White cell  count is 5.8, hemoglobin 17, hematocrit 46, platelet count 198.  IMPRESSION:  Ralph Buck is a 52 year old gentleman with stage I ductal carcinoma of the right breast.  Again, this is male breast cancer.  His tumor was ER positive.  We did go ahead and give him adjuvant chemotherapy with dose dense adria/Cytoxan.  He tolerated this well.  He completed this back in October of 2011.  He is on Femara.  I will plan for 5 years of Femara total.  We will go ahead and plan to get him back in another 4 months.  Once we get him through next year then we can go every 6 months.    ______________________________ Josph Macho, M.D. PRE/MEDQ  D:  04/25/2012  T:  04/26/2012  Job:  [id number]

## 2012-04-27 ENCOUNTER — Telehealth: Payer: Self-pay | Admitting: *Deleted

## 2012-04-27 NOTE — Telephone Encounter (Signed)
Message copied by Anselm Jungling on Wed Apr 27, 2012 11:00 AM ------      Message from: Josph Macho      Created: Tue Apr 26, 2012  2:04 PM       CALL - LABS LOOK GOOD!!!  PETE

## 2012-04-27 NOTE — Telephone Encounter (Signed)
Called patient to let him know that his labwork was all good per dr. Myna Hidalgo

## 2012-06-28 ENCOUNTER — Encounter (INDEPENDENT_AMBULATORY_CARE_PROVIDER_SITE_OTHER): Payer: Self-pay | Admitting: General Surgery

## 2012-08-22 ENCOUNTER — Other Ambulatory Visit (HOSPITAL_BASED_OUTPATIENT_CLINIC_OR_DEPARTMENT_OTHER): Payer: 59 | Admitting: Lab

## 2012-08-22 ENCOUNTER — Ambulatory Visit (HOSPITAL_BASED_OUTPATIENT_CLINIC_OR_DEPARTMENT_OTHER): Payer: 59 | Admitting: Hematology & Oncology

## 2012-08-22 VITALS — BP 136/84 | HR 97 | Temp 98.2°F | Resp 18 | Ht 74.0 in | Wt 253.0 lb

## 2012-08-22 DIAGNOSIS — C50029 Malignant neoplasm of nipple and areola, unspecified male breast: Secondary | ICD-10-CM

## 2012-08-22 DIAGNOSIS — C50929 Malignant neoplasm of unspecified site of unspecified male breast: Secondary | ICD-10-CM

## 2012-08-22 DIAGNOSIS — N289 Disorder of kidney and ureter, unspecified: Secondary | ICD-10-CM

## 2012-08-22 DIAGNOSIS — Z170 Estrogen receptor positive status [ER+]: Secondary | ICD-10-CM

## 2012-08-22 LAB — COMPREHENSIVE METABOLIC PANEL
ALT: 70 U/L — ABNORMAL HIGH (ref 0–53)
AST: 39 U/L — ABNORMAL HIGH (ref 0–37)
Albumin: 4.5 g/dL (ref 3.5–5.2)
Alkaline Phosphatase: 91 U/L (ref 39–117)
BUN: 15 mg/dL (ref 6–23)
Calcium: 9.5 mg/dL (ref 8.4–10.5)
Chloride: 107 mEq/L (ref 96–112)
Potassium: 4.2 mEq/L (ref 3.5–5.3)
Sodium: 141 mEq/L (ref 135–145)
Total Protein: 7 g/dL (ref 6.0–8.3)

## 2012-08-22 LAB — CBC WITH DIFFERENTIAL (CANCER CENTER ONLY)
BASO#: 0.1 10*3/uL (ref 0.0–0.2)
EOS%: 2.2 % (ref 0.0–7.0)
Eosinophils Absolute: 0.1 10*3/uL (ref 0.0–0.5)
HGB: 16.9 g/dL (ref 13.0–17.1)
LYMPH#: 1.8 10*3/uL (ref 0.9–3.3)
MCH: 31.1 pg (ref 28.0–33.4)
MCHC: 36.3 g/dL — ABNORMAL HIGH (ref 32.0–35.9)
MONO%: 8.7 % (ref 0.0–13.0)
NEUT#: 3.1 10*3/uL (ref 1.5–6.5)
Platelets: 199 10*3/uL (ref 145–400)
RBC: 5.44 10*6/uL (ref 4.20–5.70)

## 2012-08-22 NOTE — Progress Notes (Signed)
This office note has been dictated.

## 2012-08-23 NOTE — Progress Notes (Signed)
CC:   Robert A. Nicholos Johns, M.D.  DIAGNOSIS:  Stage I (T1c N0 M0) ductal carcinoma of the right breast- male breast cancer.  CURRENT THERAPY:  Femara 2.5 mg p.o. daily.  INTERIM HISTORY:  Ralph Buck comes in for his followup.  He is doing okay.  He has lost some weight.  He was told that he had "stage 3" kidney disease.  His creatinine has been up a little bit.  It has been holding stable, however, for about 2 years.  He has lost weight.  He is eating well.  He says he is motivated now to try to lose a little bit more weight.  He is still working without any difficulties.  There is a lot of stress with his job.  His daughters are doing well.  The are both on scholarship for basketball.  They have played well this year.  He has had no problems with cough or shortness of breath.  There is no change in bowel or bladder habits.  He has had no leg swelling.  There have been no rashes.  Overall, his performance status is ECOG 0.  PHYSICAL EXAMINATION:  General:  This is a well-developed, well- nourished white gentleman in no obvious distress.  Vital signs: Temperature of 98.2, pulse 97, respiratory rate 18, blood pressure 136/84.  Weight is 253.  Head and neck:  Normocephalic, atraumatic skull.  There are no ocular or oral lesions.  There are no palpable cervical or supraclavicular lymph nodes.  Lungs:  Clear bilaterally. Cardiac:  Regular rate and rhythm with a normal S1 and S2.  There are no murmurs, rubs, or bruits.  Abdomen:  Soft with good bowel sounds.  There is no palpable abdominal mass.  There is no palpable hepatosplenomegaly. Breasts:  Left breast with no masses, edema, or erythema.  There is no left axillary adenopathy.  Right breast shows a mastectomy.  No right chest wall nodules are noted.  There is no right axillary adenopathy. Back:  No tenderness over the spine, ribs, or hips.  Extremities:  No clubbing, cyanosis, or edema.  Neurological:  No focal  neurological deficits.  LABORATORY STUDIES:  White cell count is 5.5, hemoglobin 17, hematocrit 46.5, platelet count 199.  IMPRESSION:  Ralph Buck is a 52 year old gentleman with stage I infiltrating ductal carcinoma of the right breast.  He underwent dose- dense chemotherapy with Adriamycin/Cytoxan that was completed back in October 2011.  He had 6 cycles of therapy.  He continues on Femara.  He will complete 5 years of Femara in 2016.  We will get him back in 4 more months.  If all is good in 4 months, then we will make every 6 months.  I do not believe that the chemotherapy that we used for him is the etiology for his renal insufficiency.  Again, this is more of a "number scam" and he is asymptomatic.    ______________________________ Ralph Buck, M.D. PRE/MEDQ  D:  08/22/2012  T:  08/23/2012  Job:  [id number]

## 2012-08-30 ENCOUNTER — Telehealth: Payer: Self-pay | Admitting: Hematology & Oncology

## 2012-08-30 NOTE — Telephone Encounter (Addendum)
Message copied by Cathi Roan on Tue Aug 30, 2012  2:53 PM ------      Message from: Josph Macho      Created: Sun Aug 28, 2012  9:11 PM       Call - renal function is not really changed!!  Ralph Buck  2-14-  2:50 Called and spoke to patient at home regarding above MD message, patient states he would continue to watch his diet.  Lupita Raider  LPn ------

## 2012-10-07 ENCOUNTER — Encounter (INDEPENDENT_AMBULATORY_CARE_PROVIDER_SITE_OTHER): Payer: Self-pay | Admitting: General Surgery

## 2012-10-07 ENCOUNTER — Ambulatory Visit (INDEPENDENT_AMBULATORY_CARE_PROVIDER_SITE_OTHER): Payer: 59 | Admitting: General Surgery

## 2012-10-07 VITALS — BP 132/86 | HR 74 | Temp 97.0°F | Resp 16 | Ht 74.0 in | Wt 249.2 lb

## 2012-10-07 DIAGNOSIS — C50929 Malignant neoplasm of unspecified site of unspecified male breast: Secondary | ICD-10-CM

## 2012-10-07 NOTE — Progress Notes (Signed)
Chief complaint: Followup cancer of the breast  History: This returns for followup now approaching 3 years following treatment for T1 C. N0 cancer of the right breast status post right mastectomy and sentinel lymph node biopsy, chemotherapy and now on letrozole.he noted a few months ago for questionable lump in his left breast. Ultrasound was performed which was negative per his history and he is currently not able to feel any abnormalities. No abnormalities on the mastectomy site for unusual pain or other concerns.  Exam: BP 132/86  Pulse 74  Temp(Src) 97 F (36.1 C) (Temporal)  Resp 16  Ht 6\' 2"  (1.88 m)  Wt 249 lb 3.2 oz (113.036 kg)  BMI 31.98 kg/m2  Gen.: Mildly overweight, no acute distress Skin: Warm and dry the rash infection Lymph nodes: No palpable cervical, subclavicular or axillary nodes Breasts: Well-healed incision on the right without skin changes or palpable masses or axillary abnormalities. Both breasts negative exam.  Assessment and plan: Doing well with no clinical evidence of recurrent breast cancer. Return in 6 months.

## 2012-10-08 ENCOUNTER — Other Ambulatory Visit: Payer: Self-pay | Admitting: Hematology & Oncology

## 2012-10-10 NOTE — Telephone Encounter (Signed)
Refill request sent via e-prescribe for Femara. Pt was last seen in May. Refilled as this is a chronic medication for the patient.

## 2012-12-23 ENCOUNTER — Ambulatory Visit (HOSPITAL_BASED_OUTPATIENT_CLINIC_OR_DEPARTMENT_OTHER): Payer: 59 | Admitting: Hematology & Oncology

## 2012-12-23 ENCOUNTER — Other Ambulatory Visit (HOSPITAL_BASED_OUTPATIENT_CLINIC_OR_DEPARTMENT_OTHER): Payer: 59 | Admitting: Lab

## 2012-12-23 VITALS — BP 134/83 | HR 93 | Temp 98.4°F | Resp 18 | Ht 74.0 in | Wt 257.0 lb

## 2012-12-23 DIAGNOSIS — C50929 Malignant neoplasm of unspecified site of unspecified male breast: Secondary | ICD-10-CM

## 2012-12-23 DIAGNOSIS — M818 Other osteoporosis without current pathological fracture: Secondary | ICD-10-CM

## 2012-12-23 LAB — CMP (CANCER CENTER ONLY)
AST: 28 U/L (ref 11–38)
Albumin: 4 g/dL (ref 3.3–5.5)
BUN, Bld: 15 mg/dL (ref 7–22)
CO2: 30 mEq/L (ref 18–33)
Calcium: 9.5 mg/dL (ref 8.0–10.3)
Chloride: 102 mEq/L (ref 98–108)
Potassium: 3.9 mEq/L (ref 3.3–4.7)

## 2012-12-23 LAB — CBC WITH DIFFERENTIAL (CANCER CENTER ONLY)
BASO#: 0.1 10*3/uL (ref 0.0–0.2)
EOS%: 3.6 % (ref 0.0–7.0)
Eosinophils Absolute: 0.3 10*3/uL (ref 0.0–0.5)
HCT: 46.8 % (ref 38.7–49.9)
HGB: 17.1 g/dL (ref 13.0–17.1)
LYMPH#: 2 10*3/uL (ref 0.9–3.3)
MCHC: 36.5 g/dL — ABNORMAL HIGH (ref 32.0–35.9)
MONO#: 0.7 10*3/uL (ref 0.1–0.9)
NEUT#: 4.8 10*3/uL (ref 1.5–6.5)
RBC: 5.62 10*6/uL (ref 4.20–5.70)
WBC: 7.8 10*3/uL (ref 4.0–10.0)

## 2012-12-23 NOTE — Progress Notes (Signed)
This office note has been dictated.

## 2012-12-24 NOTE — Progress Notes (Signed)
CC:   Robert A. Nicholos Johns, M.D.  DIAGNOSIS:  Stage I (T1c, N0, M0) ductal carcinoma of the right breast.  CURRENT THERAPY:  Femara 2.5 mg p.o. q. day.  INTERIM HISTORY:  Mr. Raucci comes in for followup.  We saw back in May. He had a decent summer.  He is still working very hard.  He is under a lot of stress at work.  He has had no problems with cough or shortness of breath.  He has gained a little bit of weight.  He is trying to exercise more.  He said he lost 10 pounds and gained it all back.  He has had no change in bowel or bladder habits.  He has had no leg swelling.  There has been no lymphedema in his right arm.  Overall, his performance status is ECOG 0.  PHYSICAL EXAM:  General:  On his physical exam, this is a well- developed, well-nourished white gentleman in no obvious distress.  Vital Signs:  Show a temperature of 98.4, pulse 93, respiratory rate 18, blood pressure 134/83.  Weight is 257.  Head and Neck:  Exam shows a normocephalic and atraumatic skull.  There are no ocular or oral lesions.  There are no palpable cervical or supraclavicular lymph nodes. Lungs:  The lungs are clear bilaterally.  Cardiac:  Regular rate and rhythm with a normal S1 and S2.  There are no murmurs, rubs, or bruits. Breasts:  Exam shows the left breast with no masses, edema, or erythema. There is no left axillary adenopathy.  The right breast shows a mastectomy.  This is well healed.  There are no nodules or masses in the right breast.  There is no right axillary adenopathy.  Abdomen:  Soft with good bowel sounds.  There is no fluid wave.  There is no palpable hepatosplenomegaly.  Back:  Exam reveals no tenderness over the spine, ribs, or hips.  Extremities:  Show no clubbing, cyanosis, or edema. Neurological:  Exam shows no focal neurological deficits.  LABORATORY STUDIES:  Show a white cell count of 7.8, hemoglobin 17, hematocrit 47, and platelet count 203.  IMPRESSION:  Mr. Bogden is a very  nice 52 year old gentleman with male breast cancer.  He had stage I disease.  We treated him with Adriamycin/Cytoxan.  He got 6 cycles of chemotherapy.  He completed this back in October of 2011.  I think we can probably get him back in 6 months now.  I do not see any evidence of recurrent disease.  His kidney function and liver function are normal.    ______________________________ Josph Macho, M.D. PRE/MEDQ  D:  12/23/2012  T:  12/24/2012  Job:  [id number]

## 2013-01-19 ENCOUNTER — Other Ambulatory Visit: Payer: Self-pay | Admitting: Vascular Surgery

## 2013-01-19 DIAGNOSIS — I83893 Varicose veins of bilateral lower extremities with other complications: Secondary | ICD-10-CM

## 2013-02-12 ENCOUNTER — Other Ambulatory Visit: Payer: Self-pay | Admitting: Hematology & Oncology

## 2013-02-13 ENCOUNTER — Telehealth: Payer: Self-pay | Admitting: Hematology & Oncology

## 2013-02-13 NOTE — Telephone Encounter (Signed)
Pt made 3-27 appointment

## 2013-02-15 ENCOUNTER — Encounter: Payer: Self-pay | Admitting: Vascular Surgery

## 2013-02-16 ENCOUNTER — Ambulatory Visit (INDEPENDENT_AMBULATORY_CARE_PROVIDER_SITE_OTHER): Payer: 59 | Admitting: Vascular Surgery

## 2013-02-16 ENCOUNTER — Encounter (INDEPENDENT_AMBULATORY_CARE_PROVIDER_SITE_OTHER): Payer: Self-pay

## 2013-02-16 ENCOUNTER — Encounter: Payer: Self-pay | Admitting: Vascular Surgery

## 2013-02-16 ENCOUNTER — Ambulatory Visit (HOSPITAL_COMMUNITY)
Admission: RE | Admit: 2013-02-16 | Discharge: 2013-02-16 | Disposition: A | Discharge status: Home / Self Care | Payer: 59 | Source: Ambulatory Visit | Attending: Vascular Surgery | Admitting: Vascular Surgery

## 2013-02-16 VITALS — BP 126/96 | HR 78 | Ht 74.0 in | Wt 259.3 lb

## 2013-02-16 DIAGNOSIS — I83893 Varicose veins of bilateral lower extremities with other complications: Secondary | ICD-10-CM

## 2013-02-16 NOTE — Progress Notes (Signed)
[facility] [place] HISTORY AND PHYSICAL   History of Present Illness:  Patient is a 52 y.o. year old male who presents for evaluation of symptomatic varicose veins. The patient develops a feeling of tightness and swelling in his legs as the day proceed. He has some occasional aching. He also has some discomfort in his legs rub together in the varicosities touch each other in the inner thigh. He denies any prior history of bleeding or ulcerations. He denies prior history of DVT. Family history of varicose veins is unknown as the patient is adopted. Other medical problems include prior history of breast cancer status post mastectomy chemotherapy, hypertension, chronic kidney disease. These are all currently stable.  Past Medical History  Diagnosis Date  . Cancer     breast  . Hypertension   . Malignant neoplasm of other and unspecified sites of male breast 04/12/2012  . Osteoporosis, idiopathic 04/12/2012  . Chronic kidney disease     Past Surgical History  Procedure Laterality Date  . Breast surgery      Mastectomy  . Tonsillectomy  1980  . Lymph node biopsy  1974  . Port-a-cath removal  2011  . Power port  2011    Social History History  Substance Use Topics  . Smoking status: Never Smoker   . Smokeless tobacco: Never Used  . Alcohol Use: No    Family History Family History  Problem Relation Age of Onset  . Diabetes Sister   . Diabetes Daughter     Allergies  Allergies  Allergen Reactions  . Amoxicillin Hives and Itching  . Penicillins Hives and Itching     Current Outpatient Prescriptions  Medication Sig Dispense Refill  . cholecalciferol (VITAMIN D) 1000 UNITS tablet Take 2,000 Units by mouth daily.        Marland Kitchen letrozole (FEMARA) 2.5 MG tablet TAKE 1 TABLET BY MOUTH DAILY.  30 tablet  3   No current facility-administered medications for this visit.    ROS:   General:  No weight loss, Fever, chills  HEENT: No recent headaches, no nasal  bleeding, no visual changes, no sore throat  Neurologic: No dizziness, blackouts, seizures. No recent symptoms of stroke or mini- stroke. No recent episodes of slurred speech, or temporary blindness.  Cardiac: No recent episodes of chest pain/pressure, no shortness of breath at rest.  No shortness of breath with exertion.  Denies history of atrial fibrillation or irregular heartbeat  Vascular: No history of rest pain in feet.  No history of claudication.  No history of non-healing ulcer, No history of DVT   Pulmonary: No home oxygen, no productive cough, no hemoptysis,  No asthma or wheezing  Musculoskeletal:  [ ]  Arthritis, [ ]  Low back pain,  [ ]  Joint pain  Hematologic:No history of hypercoagulable state.  No history of easy bleeding.  No history of anemia  Gastrointestinal: No hematochezia or melena,  No gastroesophageal reflux, no trouble swallowing  Urinary: [ x] chronic Kidney disease, [ ]  on HD - [ ]  MWF or [ ]  TTHS, [ ]  Burning with urination, [ ]  Frequent urination, [ ]  Difficulty urinating;   Skin: No rashes  Psychological: No history of anxiety,  No history of depression   Physical Examination  Filed Vitals:   02/16/13 1023  BP: 126/96  Pulse: 78  Height: 6\' 2"  (1.88 m)  Weight: 259 lb 4.8 oz (117.618 kg)  SpO2: 99%    Body mass index is 33.28 kg/(m^2).  General:  Alert and oriented, no acute distress HEENT: Normal Neck: No bruit or JVD Pulmonary: Clear to auscultation bilaterally Cardiac: Regular Rate and Rhythm without murmur Abdomen: Soft, non-tender, non-distended, no mass Skin: No rash, large clusters of varicosities ranging from 4-9 mm in diameter bilaterally. The large clusters are over the greater saphenous vein in the left medial thigh into the calf and anterior tibial area. He has similar findings on the right leg although these are slightly less diameter. Extremity Pulses:  2+ radial, brachial, femoral, dorsalis pedis, posterior tibial pulses  bilaterally Musculoskeletal: No deformity trace edema  Neurologic: Upper and lower extremity motor 5/5 and symmetric  DATA:  The patient had bilateral venous reflux exam today. Right greater saphenous vein was 6-9 mm in diameter with reflux the lesser saphenous was thrombosed the deep veins are competent in the right leg. The left lower extremity greater saphenous vein is 4-10 mm in diameter. The lesser saphenous 3-4 mm in diameter. These both had reflux. There is also evidence of deep reflux in the left leg.   ASSESSMENT:  Bilateral superficial venous reflux symptomatic varicose veins. Some deep component to the left leg as well   PLAN:  The patient was given a prescription today for bilateral lower extremity compression stockings for symptomatic relief. He was offered possible laser ablation of his lower extremities. He will think about this. He wishes to wait on considering any procedure until next spring since his daughters are currently in the middle of the college basketball season. He will call us next spring if he wishes to consider laser ablation.  Fabienne Bruns, MD [facility] [place] Office: [phone] Pager: [phone]

## 2013-02-23 ENCOUNTER — Other Ambulatory Visit: Payer: Self-pay

## 2013-04-07 ENCOUNTER — Ambulatory Visit (INDEPENDENT_AMBULATORY_CARE_PROVIDER_SITE_OTHER): Payer: 59 | Admitting: General Surgery

## 2013-04-07 VITALS — BP 132/84 | HR 74 | Temp 97.3°F | Resp 16 | Wt 261.8 lb

## 2013-04-07 DIAGNOSIS — C50929 Malignant neoplasm of unspecified site of unspecified male breast: Secondary | ICD-10-CM

## 2013-04-07 NOTE — Progress Notes (Signed)
Chief complaint: Followup cancer of the breast  History: This returns for followup now approaching 3 1/2 years following treatment for T1 C. N0 cancer of the right breast status post right mastectomy and sentinel lymph node biopsy, chemotherapy and now on letrozole. He has no complaints in the office today. No arm swelling or unusual pain. No skin changes or lumps or other concerns.  Exam: BP 132/84  Pulse 74  Temp(Src) 97.3 F (36.3 C) (Temporal)  Resp 16  Wt 261 lb 12.8 oz (118.752 kg)  Gen.: Mildly overweight, no acute distress Skin: Warm and dry the rash infection Lymph nodes: No palpable cervical, subclavicular or axillary nodes Breasts: Well-healed incision on the right without skin changes or palpable masses or axillary abnormalities. Both breasts negative exam.  Assessment and plan: Doing well with no clinical evidence of recurrent breast cancer. Return in 6 months.

## 2013-05-04 ENCOUNTER — Telehealth: Payer: Self-pay | Admitting: Hematology & Oncology

## 2013-05-04 NOTE — Telephone Encounter (Signed)
Left message moved 3-27 to 3-20

## 2013-06-16 ENCOUNTER — Other Ambulatory Visit: Payer: Self-pay | Admitting: Hematology & Oncology

## 2013-07-07 ENCOUNTER — Ambulatory Visit (HOSPITAL_BASED_OUTPATIENT_CLINIC_OR_DEPARTMENT_OTHER): Payer: 59 | Admitting: Hematology & Oncology

## 2013-07-07 ENCOUNTER — Other Ambulatory Visit: Payer: 59 | Admitting: Lab

## 2013-07-07 ENCOUNTER — Other Ambulatory Visit (HOSPITAL_BASED_OUTPATIENT_CLINIC_OR_DEPARTMENT_OTHER): Payer: 59 | Admitting: Lab

## 2013-07-07 ENCOUNTER — Ambulatory Visit: Payer: 59 | Admitting: Hematology & Oncology

## 2013-07-07 VITALS — BP 143/91 | HR 98 | Temp 98.1°F | Resp 20 | Ht 74.0 in | Wt 258.0 lb

## 2013-07-07 DIAGNOSIS — C50929 Malignant neoplasm of unspecified site of unspecified male breast: Secondary | ICD-10-CM

## 2013-07-07 DIAGNOSIS — M818 Other osteoporosis without current pathological fracture: Secondary | ICD-10-CM

## 2013-07-07 LAB — CBC WITH DIFFERENTIAL (CANCER CENTER ONLY)
BASO#: 0.1 10*3/uL (ref 0.0–0.2)
BASO%: 1.2 % (ref 0.0–2.0)
EOS%: 4.4 % (ref 0.0–7.0)
Eosinophils Absolute: 0.3 10*3/uL (ref 0.0–0.5)
HEMATOCRIT: 48.2 % (ref 38.7–49.9)
HGB: 17.7 g/dL — ABNORMAL HIGH (ref 13.0–17.1)
LYMPH#: 1.9 10*3/uL (ref 0.9–3.3)
LYMPH%: 27.2 % (ref 14.0–48.0)
MCH: 30.6 pg (ref 28.0–33.4)
MCHC: 36.7 g/dL — AB (ref 32.0–35.9)
MCV: 83 fL (ref 82–98)
MONO#: 0.6 10*3/uL (ref 0.1–0.9)
MONO%: 8.1 % (ref 0.0–13.0)
NEUT#: 4 10*3/uL (ref 1.5–6.5)
NEUT%: 59.1 % (ref 40.0–80.0)
Platelets: 210 10*3/uL (ref 145–400)
RBC: 5.78 10*6/uL — ABNORMAL HIGH (ref 4.20–5.70)
RDW: 13.1 % (ref 11.1–15.7)
WBC: 6.8 10*3/uL (ref 4.0–10.0)

## 2013-07-07 NOTE — Progress Notes (Signed)
Hematology and Oncology Follow Up Visit  Ralph Buck [id number] [birth year] 53 y.o. 07/07/2013   Principle Diagnosis:   Stage I [id number]_ ductal carcinoma of the right breast  Current Therapy:   Femara 2.5 mg by mouth daily    Interim History:  Mr.  Buck is back for followup. We see him every 6 months. He has a new job. This is very stressful for him. 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  He's had no problems with the Femara. He's had no hot flashes or sweats. He's gained some weight. He really cannot exercise like he wants. He is a working about 12 hours a day. He even worse on the weekend.  He's had no cough or shortness of breath. His had no nausea vomiting. There's been no change in bowel or bladder habits. He's had no leg swelling. There's been no rashes. Medications: Current outpatient prescriptions:cholecalciferol (VITAMIN D) 1000 UNITS tablet, Take 2,000 Units by mouth daily.  , Disp: , Rfl: ;  letrozole (FEMARA) 2.5 MG tablet, TAKE 1 TABLET BY MOUTH DAILY., Disp: 30 tablet, Rfl: 3  Allergies:  Allergies  Allergen Reactions  . Amoxicillin Hives and Itching  . Penicillins Hives and Itching    Past Medical History, Surgical history, Social history, and Family History were reviewed and updated.  Review of Systems: As above  Physical Exam:  height is 6\' 2"  (1.88 m) and weight is 258 lb (117.028 kg). His oral temperature is 98.1 F (36.7 C). His blood pressure is 143/91 and his pulse is 98. His respiration is 20.   Well developed well-nourished gentleman. He is mildly obese. His lungs are clear. Cardiac exam regular in rhythm with no murmurs rubs or bruits. Breast exam shows the breast with no masses. No axillary adenopathy. Right chest wall is well-healed mastectomy. No right chest wall nodules. The right axillary adenopathy. Abdomen soft. Mildly obese. Good bowel sounds. No palpable liver or spleen tip. Back exam no tenderness over the spine ribs or hips. Extremities no  clubbing cyanosis or edema. Good strength. Good range of motion. Skin exam no rashes. Neurological exam no focal deficits.  Lab Results  Component Value Date   WBC 6.8 07/07/2013   HGB 17.7* 07/07/2013   HCT 48.2 07/07/2013   MCV 83 07/07/2013   PLT 210 07/07/2013     Chemistry      Component Value Date/Time   NA 139 12/23/2012 1530   NA 141 08/22/2012 1536   K 3.9 12/23/2012 1530   K 4.2 08/22/2012 1536   CL 102 12/23/2012 1530   CL 107 08/22/2012 1536   CO2 30 12/23/2012 1530   CO2 24 08/22/2012 1536   BUN 15 12/23/2012 1530   BUN 15 08/22/2012 1536   CREATININE 1.1 12/23/2012 1530   CREATININE 1.36* 08/22/2012 1536      Component Value Date/Time   CALCIUM 9.5 12/23/2012 1530   CALCIUM 9.5 08/22/2012 1536   ALKPHOS 77 12/23/2012 1530   ALKPHOS 91 08/22/2012 1536   AST 28 12/23/2012 1530   AST 39* 08/22/2012 1536   ALT 46 12/23/2012 1530   ALT 70* 08/22/2012 1536   BILITOT 1.20 12/23/2012 1530   BILITOT 1.0 08/22/2012 1536         Impression and Plan: Mr. Zuercher is a 53 year old white male.Marland Kitchen He is male breast cancer. He had a mastectomy. We did give him adjuvant chemotherapy with Adriamycin/Cytoxan. He got sick cycles that were completed in October of 2011.  His on  Femara. He'll be on Femara for 5 years.  I'll plan to get him back to see me in 6 more months.  Do not see need for scans.   Volanda Napoleon, MD 3/20/20154:22 PM

## 2013-07-08 LAB — COMPREHENSIVE METABOLIC PANEL
ALK PHOS: 89 U/L (ref 39–117)
ALT: 62 U/L — ABNORMAL HIGH (ref 0–53)
AST: 36 U/L (ref 0–37)
Albumin: 4.3 g/dL (ref 3.5–5.2)
BILIRUBIN TOTAL: 1.1 mg/dL (ref 0.2–1.2)
BUN: 15 mg/dL (ref 6–23)
CO2: 26 mEq/L (ref 19–32)
CREATININE: 1.28 mg/dL (ref 0.50–1.35)
Calcium: 9.5 mg/dL (ref 8.4–10.5)
Chloride: 104 mEq/L (ref 96–112)
Glucose, Bld: 116 mg/dL — ABNORMAL HIGH (ref 70–99)
Potassium: 3.9 mEq/L (ref 3.5–5.3)
Sodium: 141 mEq/L (ref 135–145)
Total Protein: 7 g/dL (ref 6.0–8.3)

## 2013-07-08 LAB — LACTATE DEHYDROGENASE: LDH: 188 U/L (ref 94–250)

## 2013-07-08 LAB — VITAMIN D 25 HYDROXY (VIT D DEFICIENCY, FRACTURES): Vit D, 25-Hydroxy: 61 ng/mL (ref 30–89)

## 2013-07-09 ENCOUNTER — Encounter: Payer: Self-pay | Admitting: *Deleted

## 2013-07-14 ENCOUNTER — Ambulatory Visit: Payer: 59 | Admitting: Hematology & Oncology

## 2013-07-14 ENCOUNTER — Other Ambulatory Visit: Payer: 59 | Admitting: Lab

## 2013-09-18 ENCOUNTER — Encounter: Payer: Self-pay | Admitting: Vascular Surgery

## 2013-09-19 ENCOUNTER — Ambulatory Visit (INDEPENDENT_AMBULATORY_CARE_PROVIDER_SITE_OTHER): Payer: 59 | Admitting: Vascular Surgery

## 2013-09-19 ENCOUNTER — Encounter: Payer: Self-pay | Admitting: Vascular Surgery

## 2013-09-19 VITALS — BP 131/98 | HR 86 | Ht 74.0 in | Wt 258.0 lb

## 2013-09-19 DIAGNOSIS — I83893 Varicose veins of bilateral lower extremities with other complications: Secondary | ICD-10-CM | POA: Insufficient documentation

## 2013-09-19 NOTE — Progress Notes (Signed)
Subjective:     Patient ID: Ralph Buck, male   DOB: November 27, 1960, 53 y.o.   MRN: [id number]  HPI this 53 -year-old male returns for continued followup regarding his severe painful varicosities in both lower extremities. He was evaluated in October of 2014 and since then has been trying long-leg elastic compression stockings 20-30 mm gradient as well as elevation and ibuprofen. He's had no success and aching throbbing and burning discomfort which he experiences in both legs and worsened as the day progresses. He developed edema in both ankles left worse than right. He has no history of DVT. It is affecting his ability to work.  Past Medical History  Diagnosis Date  . Cancer     breast  . Hypertension   . Malignant neoplasm of other and unspecified sites of male breast 04/12/2012  . Osteoporosis, idiopathic 04/12/2012  . Chronic kidney disease     History  Substance Use Topics  . Smoking status: Never Smoker   . Smokeless tobacco: Never Used  . Alcohol Use: No    Family History  Problem Relation Age of Onset  . Diabetes Sister   . Diabetes Daughter     Allergies  Allergen Reactions  . Amoxicillin Hives and Itching  . Penicillins Hives and Itching    Current outpatient prescriptions:aspirin 81 MG tablet, Take 81 mg by mouth daily., Disp: , Rfl: ;  cholecalciferol (VITAMIN D) 1000 UNITS tablet, Take 2,000 Units by mouth daily.  , Disp: , Rfl: ;  letrozole (FEMARA) 2.5 MG tablet, TAKE 1 TABLET BY MOUTH DAILY., Disp: 30 tablet, Rfl: 3  BP 131/98  Pulse 86  Ht 6\' 2"  (1.88 m)  Wt 258 lb (117.028 kg)  BMI 33.11 kg/m2  SpO2 99%  Body mass index is 33.11 kg/(m^2).           Review of Systems denies chest pain, dyspnea on exertion, PND, orthopnea, hemoptysis     Objective:   Physical Exam BP 131/98  Pulse 86  Ht 6\' 2"  (1.88 m)  Wt 258 lb (117.028 kg)  BMI 33.11 kg/m2  SpO2 99%  General well-developed well-nourished male no apparent stress alert and oriented  x3 Lungs no rhonchi or wheezing Left leg with large bulging varicosities in the mid to distal thigh extending into the medial calf and pretibial region with 1+ edema and early hyperpigmentation but no ulcer. 2 posterior cells pedis pulse palpable. Right leg with bulging varicosities in the medial thigh and medial calf with 1+ edema and no hyperpigmentation or ulceration.  Patient has documented gross reflux in both great saphenous systems and thrombosis of the right small saphenous vein-chronic     Assessment:     Severe bilateral painful varicosities due to gross reflux in both great saphenous systems-affecting patient daily living and not responding to conservative measures    Plan:     Patient needs #1 laser ablation left great saphenous vein plus greater than 20 stab phlebectomy followed by #2 laser ablation right great saphenous vein plus greater than 20 stab phlebectomy We'll proceed with precertification to perform this in the near future

## 2013-09-25 ENCOUNTER — Other Ambulatory Visit: Payer: Self-pay | Admitting: *Deleted

## 2013-09-25 DIAGNOSIS — I83893 Varicose veins of bilateral lower extremities with other complications: Secondary | ICD-10-CM

## 2013-09-27 ENCOUNTER — Encounter (INDEPENDENT_AMBULATORY_CARE_PROVIDER_SITE_OTHER): Payer: Self-pay | Admitting: General Surgery

## 2013-09-29 ENCOUNTER — Encounter: Payer: Self-pay | Admitting: Vascular Surgery

## 2013-10-02 ENCOUNTER — Ambulatory Visit (INDEPENDENT_AMBULATORY_CARE_PROVIDER_SITE_OTHER): Payer: Self-pay | Admitting: Vascular Surgery

## 2013-10-02 ENCOUNTER — Encounter: Payer: Self-pay | Admitting: Vascular Surgery

## 2013-10-02 VITALS — BP 145/88 | HR 90 | Resp 16 | Ht 74.0 in | Wt 260.0 lb

## 2013-10-02 DIAGNOSIS — I83893 Varicose veins of bilateral lower extremities with other complications: Secondary | ICD-10-CM | POA: Insufficient documentation

## 2013-10-02 NOTE — Progress Notes (Signed)
   Laser Ablation Procedure      Date: 10/02/2013    Ralph Buck [facility] DOB:March 07, 1961  Consent signed: Yes  Surgeon:J.D. Kellie Simmering  Procedure: Laser Ablation: left Greater Saphenous Vein  BP 145/88  Pulse 90  Resp 16  Ht 6\' 2"  (1.88 m)  Wt 260 lb (117.935 kg)  BMI 33.37 kg/m2  Start time: 3   End time: 4:25  Tumescent Anesthesia: 450 cc 0.9% NaCl with 50 cc Lidocaine HCL with 1% Epi and 15 cc 8.4% NaHCO3  Local Anesthesia: 17 cc Lidocaine HCL and NaHCO3 (ratio 2:1)  Pulsed Mode: 15 watts, 500 ms delay, 1.0 duration Total energy: 1.316, total pulses: 88, total time: 1:27    Stab Phlebectomy: >20 Sites: Thigh and Calf  Patient tolerated procedure well: Yes  Notes:   Description of Procedure:  After marking the course of the saphenous vein and the secondary varicosities in the standing position, the patient was placed on the operating table in the supine position, and the left leg was prepped and draped in sterile fashion. Local anesthetic was administered, and under ultrasound guidance the saphenous vein was accessed with a micro needle and guide wire; then the micro puncture sheath was placed. A guide wire was inserted to the saphenofemoral junction, followed by a 5 french sheath.  The position of the sheath and then the laser fiber below the junction was confirmed using the ultrasound and visualization of the aiming beam.  Tumescent anesthesia was administered along the course of the saphenous vein using ultrasound guidance. Protective laser glasses were placed on the patient, and the laser was fired at 15 watt pulsed mode advancing 1-2 mm per sec.  For a total of 1.316 joules.  A steri strip was applied to the puncture site.  The patient was then put into Trendelenburg position.  Local anesthetic was utilized overlying the marked varicosities.  Greater than 20 stab wounds were made using the tip of an 11 blade; and using the vein hook,  The phlebectomies were performed using a hemostat  to avulse these varicosities.  Adequate hemostasis was achieved, and steri strips were applied to the stab wound.      ABD pads and thigh high compression stockings were applied.  Ace wrap bandages were applied over the phlebectomy sites and at the top of the saphenofemoral junction.  Blood loss was less than 15 cc.  The patient ambulated out of the operating room having tolerated the procedure well.

## 2013-10-02 NOTE — Progress Notes (Signed)
Subjective:     Patient ID: Ralph Buck, male   DOB: 03-Apr-1961, 53 y.o.   MRN: [id number]  HPI this 53 year old male had laser ablation left great saphenous vein from mid thigh to near the saphenofemoral junction plus greater than 20 stab phlebectomy of painful varicosities performed under local tumescent anesthesia. He tolerated the procedure well.   Review of Systems     Objective:   Physical Exam BP 145/88  Pulse 90  Resp 16  Ht 6\' 2"  (1.88 m)  Wt 260 lb (117.935 kg)  BMI 33.37 kg/m2       Assessment:     Well-tolerated laser ablation left great saphenous vein plus multiple stab phlebectomy-approximately 30 of painful varicosities performed under local tumescent anesthesia    Plan:     Patient return June 29 for venous duplex exam left leg confirm closure left great saphenous vein. We'll then proceed with similar procedure on the contralateral right leg

## 2013-10-03 ENCOUNTER — Telehealth: Payer: Self-pay | Admitting: *Deleted

## 2013-10-03 NOTE — Telephone Encounter (Signed)
Pt doing well. No bleeding. Unable to take Ibuprofen due to his kidney disease. Taking Tylenol and using ice. Reminded him of his fu appt.

## 2013-10-06 ENCOUNTER — Telehealth: Payer: Self-pay

## 2013-10-06 NOTE — Telephone Encounter (Signed)
Phone call from pt.  Reported increased swelling and redness of left upper inner thigh yesterday, when he ret'd from work.  Stated he thinks the worsening symptoms are related to not being able to pull the thigh-high stocking up the the top of the thigh. Described increased swelling above the stocking that stopped approx. 5-6 " below the left groin.  Attempted to wrap with an ace bandage above the stocking, but reported increased swelling, redness and tenderness in the upper, inner thigh @ end of work day.  Stated today the swelling is improved, but the redness and tenderness is still the same.  Denied any firmness to area. Reported he hasn't been able to take the doses of Ibuprofen, as directed, due to kidney disease.  Encouraged to continue compression, elevation and to use ice compresses to left upper, inner thigh.  Suggested, to wear compression stocking up to just above knee, and fold the excess stocking down, to lay against itself, and to wrap the ace bandage, from below the knee, up to the left groin.  Encouraged to wrap ace with a steady, gentle pressure on the leg.  Verb. Understanding.  Advised to monitor for swelling in the foot and lower leg, and to report worsening symptoms.

## 2013-10-16 ENCOUNTER — Encounter (HOSPITAL_COMMUNITY): Payer: 59

## 2013-10-16 ENCOUNTER — Ambulatory Visit: Payer: 59 | Admitting: Vascular Surgery

## 2013-10-16 ENCOUNTER — Encounter: Payer: Self-pay | Admitting: Vascular Surgery

## 2013-10-17 ENCOUNTER — Encounter: Payer: Self-pay | Admitting: Vascular Surgery

## 2013-10-17 ENCOUNTER — Ambulatory Visit (INDEPENDENT_AMBULATORY_CARE_PROVIDER_SITE_OTHER): Payer: 59 | Admitting: Vascular Surgery

## 2013-10-17 ENCOUNTER — Ambulatory Visit (HOSPITAL_COMMUNITY)
Admission: RE | Admit: 2013-10-17 | Discharge: 2013-10-17 | Disposition: A | Discharge status: Home / Self Care | Payer: 59 | Source: Ambulatory Visit | Attending: Vascular Surgery | Admitting: Vascular Surgery

## 2013-10-17 VITALS — BP 141/93 | HR 92 | Resp 16 | Ht 74.0 in | Wt 260.0 lb

## 2013-10-17 DIAGNOSIS — I83893 Varicose veins of bilateral lower extremities with other complications: Secondary | ICD-10-CM

## 2013-10-17 NOTE — Progress Notes (Signed)
Subjective:     Patient ID: Ralph Buck, male   DOB: March 06, 1961, 53 y.o.   MRN: [id number]  HPI this 53 year old male returns 1 week post left great saphenous vein laser ablation plus multiple stab phlebectomy of painful varicosities. He has had a moderate amount of discomfort in the distal thigh over the ablation site with some redness and tenderness. He was unable to take a full dose of ibuprofen because of some renal issues. He states it is getting much better over the last few days. No pain in the stab phlebectomy sites. He denies any chest pain, dyspnea on exertion, PND, orthopnea, or hemoptysis.   Review of Systems     Objective:   Physical Exam BP 141/93  Pulse 92  Resp 16  Ht 6\' 2"  (1.88 m)  Wt 260 lb (117.935 kg)  BMI 33.37 kg/m2  General well-developed well-nourished male in no apparent distress alert and oriented x3 Left leg with mild to moderate tenderness in distal thigh over ablation site and great saphenous vein. Saphenectomy site is healing nicely. 3 posterior cells pedis pulse palpable.  Today I ordered a venous duplex exam the left leg which are viewed and interpreted. The great saphenous vein is closed from the mid thigh to near the saphenofemoral junction with no DVT.      Assessment:     Successful laser ablation left great saphenous vein with greater than 20 stab phlebectomy of painful varicosities    Plan:     We'll schedule same procedure on contralateral right leg in the near future Patient will begin daily aspirin

## 2013-10-23 ENCOUNTER — Other Ambulatory Visit: Payer: Self-pay | Admitting: *Deleted

## 2013-10-23 DIAGNOSIS — I83893 Varicose veins of bilateral lower extremities with other complications: Secondary | ICD-10-CM

## 2013-10-24 ENCOUNTER — Other Ambulatory Visit: Payer: Self-pay | Admitting: *Deleted

## 2013-10-26 ENCOUNTER — Other Ambulatory Visit: Payer: Self-pay | Admitting: Hematology & Oncology

## 2013-11-01 ENCOUNTER — Encounter: Payer: Self-pay | Admitting: Vascular Surgery

## 2013-11-02 ENCOUNTER — Encounter: Payer: Self-pay | Admitting: Vascular Surgery

## 2013-11-02 ENCOUNTER — Ambulatory Visit (INDEPENDENT_AMBULATORY_CARE_PROVIDER_SITE_OTHER): Payer: 59 | Admitting: Vascular Surgery

## 2013-11-02 VITALS — BP 150/99 | HR 99 | Resp 16 | Ht 74.0 in | Wt 258.0 lb

## 2013-11-02 DIAGNOSIS — I83893 Varicose veins of bilateral lower extremities with other complications: Secondary | ICD-10-CM

## 2013-11-02 NOTE — Progress Notes (Signed)
Subjective:     Patient ID: Ralph Buck, male   DOB: 1960-11-15, 53 y.o.   MRN: [id number]  HPI this 53 year old male had laser ablation right great saphenous vein with greater than 20 stab phlebectomy of painful varicosities performed under local tumescent anesthesia. A total of 1823 J of energy was utilized. He tolerated the procedure well.  Review of Systems     Objective:   Physical Exam BP 150/99  Pulse 99  Resp 16  Ht 6\' 2"  (1.88 m)  Wt 258 lb (117.028 kg)  BMI 33.11 kg/m2        Assessment:     Well-tolerated laser ablation right great saphenous vein with greater than 20 stab phlebectomy of painful varicosities performed under local tumescent anesthesia    Plan:     Return in one week for venous duplex exam to confirm closure right great saphenous vein This will conclude patient's treatment regimen

## 2013-11-02 NOTE — Progress Notes (Signed)
   Laser Ablation Procedure      Date: 11/02/2013    Ralph Buck [facility] DOB:04-22-1960  Consent signed: Yes  Surgeon:J.D. Kellie Simmering  Procedure: Laser Ablation: right Greater Saphenous Vein  BP 150/99  Pulse 99  Resp 16  Ht 6\' 2"  (1.88 m)  Wt 258 lb (117.028 kg)  BMI 33.11 kg/m2  Start time: 8:40   End time: 9:50  Tumescent Anesthesia: 490 cc 0.9% NaCl with 50 cc Lidocaine HCL with 1% Epi and 15 cc 8.4% NaHCO3  Local Anesthesia: 490 cc Lidocaine HCL and NaHCO3 (ratio 2:1)  Pulsed mode: 15 watts, 526ms delay, 1.0 duration Total energy: 1823, total pulses: 122, total time: 2:01     Stab Phlebectomy: >20 Sites: Thigh and Calf  Patient tolerated procedure well: Yes  Notes:   Description of Procedure:  After marking the course of the saphenous vein and the secondary varicosities in the standing position, the patient was placed on the operating table in the supine position, and the right leg was prepped and draped in sterile fashion. Local anesthetic was administered, and under ultrasound guidance the saphenous vein was accessed with a micro needle and guide wire; then the micro puncture sheath was placed. A guide wire was inserted to the saphenofemoral junction, followed by a 5 french sheath.  The position of the sheath and then the laser fiber below the junction was confirmed using the ultrasound and visualization of the aiming beam.  Tumescent anesthesia was administered along the course of the saphenous vein using ultrasound guidance. Protective laser glasses were placed on the patient, and the laser was fired at 15 watt pulsed mode advancing 1-2 mm per sec.  For a total of 1823 joules.  A steri strip was applied to the puncture site.  The patient was then put into Trendelenburg position.  Local anesthetic was utilized overlying the marked varicosities.  Greater than 20 stab wounds were made using the tip of an 11 blade; and using the vein hook,  The phlebectomies were performed using a  hemostat to avulse these varicosities.  Adequate hemostasis was achieved, and steri strips were applied to the stab wound.      ABD pads and thigh high compression stockings were applied.  Ace wrap bandages were applied over the phlebectomy sites and at the top of the saphenofemoral junction.  Blood loss was less than 15 cc.  The patient ambulated out of the operating room having tolerated the procedure well.

## 2013-11-06 ENCOUNTER — Encounter: Payer: Self-pay | Admitting: Vascular Surgery

## 2013-11-07 ENCOUNTER — Ambulatory Visit (HOSPITAL_COMMUNITY)
Admission: RE | Admit: 2013-11-07 | Discharge: 2013-11-07 | Disposition: A | Discharge status: Home / Self Care | Payer: 59 | Source: Ambulatory Visit | Attending: Vascular Surgery | Admitting: Vascular Surgery

## 2013-11-07 ENCOUNTER — Encounter: Payer: Self-pay | Admitting: Vascular Surgery

## 2013-11-07 ENCOUNTER — Ambulatory Visit (INDEPENDENT_AMBULATORY_CARE_PROVIDER_SITE_OTHER): Payer: 59 | Admitting: Vascular Surgery

## 2013-11-07 VITALS — BP 138/101 | HR 81 | Ht 74.0 in | Wt 259.0 lb

## 2013-11-07 DIAGNOSIS — I83893 Varicose veins of bilateral lower extremities with other complications: Secondary | ICD-10-CM | POA: Insufficient documentation

## 2013-11-07 DIAGNOSIS — I82401 Acute embolism and thrombosis of unspecified deep veins of right lower extremity: Secondary | ICD-10-CM

## 2013-11-07 DIAGNOSIS — I82409 Acute embolism and thrombosis of unspecified deep veins of unspecified lower extremity: Secondary | ICD-10-CM | POA: Insufficient documentation

## 2013-11-07 MED ORDER — APIXABAN 5 MG PO TABS: 5.0000 mg | ORAL_TABLET | Freq: Two times a day (BID) | ORAL | Status: DC

## 2013-11-07 NOTE — Progress Notes (Signed)
Patient is a 53 year old male who returns for followup today. He had ablation of his right greater saphenous vein by Dr. Kellie Simmering approximately one week ago. He still has some soreness in his right leg but otherwise no complaints. He denies shortness of breath or chest pain.  Physical exam:  Filed Vitals:   11/07/13 1557  BP: 138/101  Pulse: 81  Height: 6\' 2"  (1.88 m)  Weight: 259 lb (117.482 kg)  SpO2: 97%    Data: Obliteration of right greater saphenous vein with extension of thrombus approximately 5 cm into the common femoral vein with mobile thrombus  Assessment: Successful obliteration of right greater saphenous vein but with DVT of right common femoral vein. Currently he is asymptomatic.  Plan: I discussed with the patient today that he had a DVT in his right leg. I also discussed with him that he would need anticoagulation for this. We discussed the advantages and disadvantages of Lovenox and Coumadin versus a thrombin inhibitor. He would prefer thrombin inhibitor at this point. He was given a prescription today for Eliquis 10 mg twice daily for one week. He will then convert this to Eliquis 5 mg twice daily for a total of 6 months. He does have a history of renal dysfunction in the past but I did review his creatinine from March of this year and it was normal. We will check his renal function in one month when he returns for followup with Dr. Kellie Simmering. In the event the medication his cost prohibitive, we will instead go with Lovenox and Coumadin.  Ruta Hinds, MD [facility] [place] Office: [phone] Pager: [phone]

## 2013-11-14 ENCOUNTER — Ambulatory Visit: Payer: 59 | Admitting: Vascular Surgery

## 2013-11-14 ENCOUNTER — Other Ambulatory Visit (HOSPITAL_COMMUNITY): Payer: 59

## 2013-12-05 ENCOUNTER — Other Ambulatory Visit: Payer: Self-pay | Admitting: Vascular Surgery

## 2013-12-06 LAB — BASIC METABOLIC PANEL
BUN: 11 mg/dL (ref 6–23)
CHLORIDE: 103 meq/L (ref 96–112)
CO2: 26 mEq/L (ref 19–32)
Calcium: 9.5 mg/dL (ref 8.4–10.5)
Creat: 1.28 mg/dL (ref 0.50–1.35)
Glucose, Bld: 160 mg/dL — ABNORMAL HIGH (ref 70–99)
Potassium: 3.9 mEq/L (ref 3.5–5.3)
Sodium: 139 mEq/L (ref 135–145)

## 2013-12-11 ENCOUNTER — Encounter: Payer: Self-pay | Admitting: Vascular Surgery

## 2013-12-12 ENCOUNTER — Ambulatory Visit (INDEPENDENT_AMBULATORY_CARE_PROVIDER_SITE_OTHER): Payer: Self-pay | Admitting: Vascular Surgery

## 2013-12-12 ENCOUNTER — Encounter: Payer: Self-pay | Admitting: Vascular Surgery

## 2013-12-12 VITALS — BP 139/101 | HR 75 | Resp 16 | Ht 74.0 in | Wt 253.0 lb

## 2013-12-12 DIAGNOSIS — I83893 Varicose veins of bilateral lower extremities with other complications: Secondary | ICD-10-CM

## 2013-12-12 NOTE — Progress Notes (Signed)
Subjective:     Patient ID: Ralph Buck, male   DOB: 1960/10/31, 53 y.o.   MRN: [id number]  HPI this 53 year old male returns for followup regarding his mobile thrombus extending into the right common femoral vein post laser ablation. He was seen by Dr. fields 4 weeks ago who started the patient on Eloquis . Patient states that he has had no significant edema in the right leg and his legs feel fine. He's had no chest pain or dyspnea on exertion. He has been wearing elastic compression stockings.  Past Medical History  Diagnosis Date  . Cancer     breast  . Hypertension   . Malignant neoplasm of other and unspecified sites of male breast 04/12/2012  . Osteoporosis, idiopathic 04/12/2012  . Chronic kidney disease     History  Substance Use Topics  . Smoking status: Never Smoker   . Smokeless tobacco: Never Used  . Alcohol Use: No    Family History  Problem Relation Age of Onset  . Diabetes Sister   . Diabetes Daughter     Allergies  Allergen Reactions  . Amoxicillin Hives and Itching  . Penicillins Hives and Itching    Current outpatient prescriptions:apixaban (ELIQUIS) 5 MG TABS tablet, Take 1 tablet (5 mg total) by mouth 2 (two) times daily., Disp: 60 tablet, Rfl: 5;  aspirin 81 MG tablet, Take 81 mg by mouth daily., Disp: , Rfl: ;  cholecalciferol (VITAMIN D) 1000 UNITS tablet, Take 2,000 Units by mouth daily.  , Disp: , Rfl: ;  letrozole (FEMARA) 2.5 MG tablet, TAKE 1 TABLET BY MOUTH DAILY., Disp: 30 tablet, Rfl: 3  BP 139/101  Pulse 75  Resp 16  Ht 6\' 2"  (1.88 m)  Wt 253 lb (114.76 kg)  BMI 32.47 kg/m2  Body mass index is 32.47 kg/(m^2).          Review of Systems denies chest pain, hemoptysis, dyspnea on exertion, PND, orthopnea     Objective:   Physical Exam BP 139/101  Pulse 75  Resp 16  Ht 6\' 2"  (1.88 m)  Wt 253 lb (114.76 kg)  BMI 32.47 kg/m2  General well-developed well-nourished male in no apparent stress alert and oriented x3 Lungs no  rhonchi or wheezing Right leg with no edema. 3+ dorsalis pedis pulse palpable bilaterally.  Today I visualize the right saphenofemoral junction with the SonoSite ultrasound. There is no evidence of any mobile thrombus in the right common femoral vein is widely patent both proximal and distal to the saphenofemoral junction  Today I      Assessment:     Status post bilateral great saphenous vein laser ablations currently on anticoagulations because of mobile thrombus at right saphenofemoral junction 4 weeks ago-now resolved    Plan:     Will discontinue anticoagulations in 6 weeks and then begin taking aspirin 81 mg tablet daily X. line return to see [place] on when necessary basis

## 2014-01-05 ENCOUNTER — Other Ambulatory Visit: Payer: 59 | Admitting: Lab

## 2014-01-05 ENCOUNTER — Ambulatory Visit: Payer: 59 | Admitting: Hematology & Oncology

## 2014-01-23 ENCOUNTER — Other Ambulatory Visit: Payer: Self-pay | Admitting: *Deleted

## 2014-01-23 DIAGNOSIS — I82401 Acute embolism and thrombosis of unspecified deep veins of right lower extremity: Secondary | ICD-10-CM

## 2014-01-24 ENCOUNTER — Encounter: Payer: Self-pay | Admitting: Hematology & Oncology

## 2014-01-24 ENCOUNTER — Ambulatory Visit (HOSPITAL_BASED_OUTPATIENT_CLINIC_OR_DEPARTMENT_OTHER): Payer: 59 | Admitting: Hematology & Oncology

## 2014-01-24 ENCOUNTER — Other Ambulatory Visit (HOSPITAL_BASED_OUTPATIENT_CLINIC_OR_DEPARTMENT_OTHER): Payer: 59 | Admitting: Lab

## 2014-01-24 VITALS — BP 132/68 | HR 90 | Temp 98.4°F | Resp 18 | Ht 73.0 in | Wt 260.0 lb

## 2014-01-24 DIAGNOSIS — C50521 Malignant neoplasm of lower-outer quadrant of right male breast: Secondary | ICD-10-CM

## 2014-01-24 DIAGNOSIS — C50921 Malignant neoplasm of unspecified site of right male breast: Secondary | ICD-10-CM

## 2014-01-24 DIAGNOSIS — I82409 Acute embolism and thrombosis of unspecified deep veins of unspecified lower extremity: Secondary | ICD-10-CM

## 2014-01-24 DIAGNOSIS — C50929 Malignant neoplasm of unspecified site of unspecified male breast: Secondary | ICD-10-CM

## 2014-01-24 DIAGNOSIS — M818 Other osteoporosis without current pathological fracture: Secondary | ICD-10-CM

## 2014-01-24 DIAGNOSIS — I82401 Acute embolism and thrombosis of unspecified deep veins of right lower extremity: Secondary | ICD-10-CM

## 2014-01-24 LAB — CBC WITH DIFFERENTIAL (CANCER CENTER ONLY)
BASO#: 0.1 10*3/uL (ref 0.0–0.2)
BASO%: 1.6 % (ref 0.0–2.0)
EOS ABS: 0.2 10*3/uL (ref 0.0–0.5)
EOS%: 3.2 % (ref 0.0–7.0)
HCT: 47 % (ref 38.7–49.9)
HGB: 17.2 g/dL — ABNORMAL HIGH (ref 13.0–17.1)
LYMPH#: 2.1 10*3/uL (ref 0.9–3.3)
LYMPH%: 33.1 % (ref 14.0–48.0)
MCH: 30.7 pg (ref 28.0–33.4)
MCHC: 36.6 g/dL — ABNORMAL HIGH (ref 32.0–35.9)
MCV: 84 fL (ref 82–98)
MONO#: 0.6 10*3/uL (ref 0.1–0.9)
MONO%: 10 % (ref 0.0–13.0)
NEUT#: 3.2 10*3/uL (ref 1.5–6.5)
NEUT%: 52.1 % (ref 40.0–80.0)
PLATELETS: 204 10*3/uL (ref 145–400)
RBC: 5.61 10*6/uL (ref 4.20–5.70)
RDW: 12.8 % (ref 11.1–15.7)
WBC: 6.2 10*3/uL (ref 4.0–10.0)

## 2014-01-24 NOTE — Progress Notes (Signed)
Hematology and Oncology Follow Up Visit  YVONNE PETITE [id number] [birth year] 53 y.o. 01/24/2014   Principle Diagnosis:   Stage I T1cN0M0_ ductal carcinoma of the right breast  DVT of RIGHT thigh  Current Therapy:   Femara 2.5 mg by mouth daily. ELIQUIS-completed 3 months of therapy    Interim History:  Mr.  Mcfadyen is back for followup. We see him every 6 months. He has a new job. This is very stressful for him. 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   Since last saw him, he developed a thrombus in was found to be a thrombus. He was on ELIQUIS. Vascular surgery was following him. He said that he had a recent Doppler done that he was told was negative for any further blood clot. He will finish up the [facility] in one week. I think he has been on it for 3 months.  He's had no problems with the Femara. He's had no hot flashes or sweats. He's gained some weight. He really cannot exercise like he wants. He is a working about 12 hours a day. He even worse on the weekend.  He's had no cough or shortness of breath. His had no nausea vomiting. There's been no change in bowel or bladder habits. He's had no leg swelling. There's been no rashes. Medications: Current outpatient prescriptions:apixaban (ELIQUIS) 5 MG TABS tablet, Take 1 tablet (5 mg total) by mouth 2 (two) times daily., Disp: 60 tablet, Rfl: 5;  cholecalciferol (VITAMIN D) 1000 UNITS tablet, Take 2,000 Units by mouth daily.  , Disp: , Rfl: ;  letrozole (FEMARA) 2.5 MG tablet, TAKE 1 TABLET BY MOUTH DAILY., Disp: 30 tablet, Rfl: 3;  Melatonin 3 MG TBDP, Take by mouth as needed., Disp: , Rfl:   Allergies:  Allergies  Allergen Reactions  . Amoxicillin Hives and Itching  . Penicillins Hives and Itching    Past Medical History, Surgical history, Social history, and Family History were reviewed and updated.  Review of Systems: As above  Physical Exam:  height is 6\' 1"  (1.854 m) and weight is 260 lb (117.935 kg). His oral temperature is  98.4 F (36.9 C). His blood pressure is 132/68 and his pulse is 90. His respiration is 18.   Well developed well-nourished gentleman. He is mildly obese. His lungs are clear. Cardiac exam regular in rhythm with no murmurs rubs or bruits. Breast exam shows the breast with no masses. No axillary adenopathy. Right chest wall is well-healed mastectomy. No right chest wall nodules. The right axillary adenopathy. Abdomen soft. Mildly obese. Good bowel sounds. No palpable liver or spleen tip. Back exam no tenderness over the spine ribs or hips. Extremities no clubbing cyanosis or edema. Good strength. Good range of motion. Skin exam no rashes. Neurological exam no focal deficits.  Lab Results  Component Value Date   WBC 6.2 01/24/2014   HGB 17.2* 01/24/2014   HCT 47.0 01/24/2014   MCV 84 01/24/2014   PLT 204 01/24/2014     Chemistry      Component Value Date/Time   NA 139 12/05/2013 1401   NA 139 12/23/2012 1530   K 3.9 12/05/2013 1401   K 3.9 12/23/2012 1530   CL 103 12/05/2013 1401   CL 102 12/23/2012 1530   CO2 26 12/05/2013 1401   CO2 30 12/23/2012 1530   BUN 11 12/05/2013 1401   BUN 15 12/23/2012 1530   CREATININE 1.28 12/05/2013 1401   CREATININE 1.28 07/07/2013 1505  Component Value Date/Time   CALCIUM 9.5 12/05/2013 1401   CALCIUM 9.5 12/23/2012 1530   ALKPHOS 89 07/07/2013 1505   ALKPHOS 77 12/23/2012 1530   AST 36 07/07/2013 1505   AST 28 12/23/2012 1530   ALT 62* 07/07/2013 1505   ALT 46 12/23/2012 1530   BILITOT 1.1 07/07/2013 1505   BILITOT 1.20 12/23/2012 1530         Impression and Plan: Mr. Fuson is a 53 year old white male.Marland Kitchen He is male breast cancer. He had a mastectomy. We did give him adjuvant chemotherapy with Adriamycin/Cytoxan. He got 6 cycles that were completed in October of 2011.  His on Femara. He'll be on Femara for 5 years. he will finish up the Femara next year in October.  I told him that he probably needs to be on aspirin. I would put him on 2 baby aspirin a day. I this  will help him. He'll start this once the ELIQUIS is finished.   I'll plan to get him back to see me in 6 more months.  Do not see need for scans.   Volanda Napoleon, MD 10/7/20154:13 PM

## 2014-01-25 LAB — COMPREHENSIVE METABOLIC PANEL
ALBUMIN: 4.1 g/dL (ref 3.5–5.2)
ALT: 48 U/L (ref 0–53)
AST: 29 U/L (ref 0–37)
Alkaline Phosphatase: 81 U/L (ref 39–117)
BUN: 16 mg/dL (ref 6–23)
CALCIUM: 9.3 mg/dL (ref 8.4–10.5)
CHLORIDE: 106 meq/L (ref 96–112)
CO2: 24 meq/L (ref 19–32)
Creatinine, Ser: 1.33 mg/dL (ref 0.50–1.35)
GLUCOSE: 136 mg/dL — AB (ref 70–99)
POTASSIUM: 4.1 meq/L (ref 3.5–5.3)
Sodium: 140 mEq/L (ref 135–145)
Total Bilirubin: 0.8 mg/dL (ref 0.2–1.2)
Total Protein: 6.8 g/dL (ref 6.0–8.3)

## 2014-01-25 LAB — VITAMIN D 25 HYDROXY (VIT D DEFICIENCY, FRACTURES): Vit D, 25-Hydroxy: 55 ng/mL (ref 30–89)

## 2014-01-25 LAB — HEMOGLOBIN A1C
Hgb A1c MFr Bld: 5.6 % (ref <5.7)
Mean Plasma Glucose: 114 mg/dL (ref <117)

## 2014-01-26 ENCOUNTER — Encounter: Payer: Self-pay | Admitting: Nurse Practitioner

## 2014-02-26 ENCOUNTER — Other Ambulatory Visit: Payer: Self-pay | Admitting: Hematology & Oncology

## 2014-06-15 ENCOUNTER — Other Ambulatory Visit: Payer: Self-pay | Admitting: General Surgery

## 2014-07-05 ENCOUNTER — Other Ambulatory Visit: Payer: Self-pay | Admitting: Radiology

## 2014-07-06 ENCOUNTER — Telehealth: Payer: Self-pay | Admitting: Hematology & Oncology

## 2014-07-06 NOTE — Telephone Encounter (Signed)
Pt aware moved 4-7 to 4-28

## 2014-07-23 ENCOUNTER — Other Ambulatory Visit: Payer: Self-pay | Admitting: Hematology & Oncology

## 2014-07-26 ENCOUNTER — Other Ambulatory Visit: Payer: 59 | Admitting: Lab

## 2014-07-26 ENCOUNTER — Ambulatory Visit: Payer: 59 | Admitting: Hematology & Oncology

## 2014-08-13 ENCOUNTER — Other Ambulatory Visit: Payer: Self-pay | Admitting: Nurse Practitioner

## 2014-08-13 MED ORDER — LETROZOLE 2.5 MG PO TABS: 2.5000 mg | ORAL_TABLET | Freq: Every day | ORAL | Status: DC

## 2014-08-16 ENCOUNTER — Ambulatory Visit (HOSPITAL_BASED_OUTPATIENT_CLINIC_OR_DEPARTMENT_OTHER): Payer: 59 | Admitting: Hematology & Oncology

## 2014-08-16 ENCOUNTER — Encounter: Payer: Self-pay | Admitting: Hematology & Oncology

## 2014-08-16 ENCOUNTER — Other Ambulatory Visit (HOSPITAL_BASED_OUTPATIENT_CLINIC_OR_DEPARTMENT_OTHER): Payer: 59

## 2014-08-16 VITALS — BP 136/96 | HR 98 | Temp 98.2°F | Resp 18 | Ht 72.0 in | Wt 270.0 lb

## 2014-08-16 DIAGNOSIS — C50521 Malignant neoplasm of lower-outer quadrant of right male breast: Secondary | ICD-10-CM

## 2014-08-16 DIAGNOSIS — M818 Other osteoporosis without current pathological fracture: Secondary | ICD-10-CM

## 2014-08-16 DIAGNOSIS — C50921 Malignant neoplasm of unspecified site of right male breast: Secondary | ICD-10-CM

## 2014-08-16 LAB — CBC WITH DIFFERENTIAL (CANCER CENTER ONLY)
BASO#: 0.1 10*3/uL (ref 0.0–0.2)
BASO%: 1.7 % (ref 0.0–2.0)
EOS%: 4 % (ref 0.0–7.0)
Eosinophils Absolute: 0.3 10*3/uL (ref 0.0–0.5)
HEMATOCRIT: 46.7 % (ref 38.7–49.9)
HGB: 17.3 g/dL — ABNORMAL HIGH (ref 13.0–17.1)
LYMPH#: 2.4 10*3/uL (ref 0.9–3.3)
LYMPH%: 30.3 % (ref 14.0–48.0)
MCH: 30.9 pg (ref 28.0–33.4)
MCHC: 37 g/dL — AB (ref 32.0–35.9)
MCV: 84 fL (ref 82–98)
MONO#: 1 10*3/uL — ABNORMAL HIGH (ref 0.1–0.9)
MONO%: 12.9 % (ref 0.0–13.0)
NEUT%: 51.1 % (ref 40.0–80.0)
NEUTROS ABS: 4 10*3/uL (ref 1.5–6.5)
Platelets: 236 10*3/uL (ref 145–400)
RBC: 5.59 10*6/uL (ref 4.20–5.70)
RDW: 12.5 % (ref 11.1–15.7)
WBC: 7.8 10*3/uL (ref 4.0–10.0)

## 2014-08-16 LAB — COMPREHENSIVE METABOLIC PANEL
ALBUMIN: 4.1 g/dL (ref 3.5–5.2)
ALT: 62 U/L — ABNORMAL HIGH (ref 0–53)
AST: 33 U/L (ref 0–37)
Alkaline Phosphatase: 99 U/L (ref 39–117)
BUN: 16 mg/dL (ref 6–23)
CALCIUM: 9.3 mg/dL (ref 8.4–10.5)
CHLORIDE: 107 meq/L (ref 96–112)
CO2: 22 mEq/L (ref 19–32)
Creatinine, Ser: 1.4 mg/dL — ABNORMAL HIGH (ref 0.50–1.35)
GLUCOSE: 92 mg/dL (ref 70–99)
Potassium: 4.2 mEq/L (ref 3.5–5.3)
Sodium: 142 mEq/L (ref 135–145)
TOTAL PROTEIN: 7 g/dL (ref 6.0–8.3)
Total Bilirubin: 0.8 mg/dL (ref 0.2–1.2)

## 2014-08-16 LAB — LACTATE DEHYDROGENASE: LDH: 210 U/L (ref 94–250)

## 2014-08-16 NOTE — Progress Notes (Signed)
Hematology and Oncology Follow Up Visit  Ralph Buck [id number] Aug 06, 1960 54 y.o. 08/16/2014   Principle Diagnosis:   Stage I T1cN0M0_ ductal carcinoma of the right breast  DVT of RIGHT thigh  Current Therapy:   Femara 2.5 mg by mouth daily. ELIQUIS-completed 3 months of therapy    Interim History:  Ralph Buck is back for followup. We see him every 6 months. The big news is that his daughter, who was actually a summer intern with [place], is now going go to medical school at [employer]. This is such a big piece of news for [place]  .  He is on aspirin now. Takes 2 baby aspirin a day. He had that thrombus in the right thigh.  He's had no bleeding. He's had no change in bowel or bladder habits. He's had no leg swelling. He's had no rashes. He's had no headache.  He has had some lipomas removed.   Medications:  Current outpatient prescriptions:  .  aspirin 81 MG tablet, Take 81 mg by mouth daily. Takes 2 tab at night, Disp: , Rfl:  .  cholecalciferol (VITAMIN D) 1000 UNITS tablet, Take 2,000 Units by mouth daily.  , Disp: , Rfl:  .  letrozole (FEMARA) 2.5 MG tablet, Take 1 tablet (2.5 mg total) by mouth daily., Disp: 30 tablet, Rfl: 6 .  Melatonin 3 MG TBDP, Take by mouth as needed., Disp: , Rfl:   Allergies:  Allergies  Allergen Reactions  . Amoxicillin Hives and Itching  . Penicillins Hives and Itching    Past Medical History, Surgical history, Social history, and Family History were reviewed and updated.  He has had  Review of Systems: As above  Physical Exam:  height is 6' (1.829 m) and weight is 270 lb (122.471 kg). His oral temperature is 98.2 F (36.8 C). His blood pressure is 136/96 and his pulse is 98. His respiration is 18.   Well developed well-nourished gentleman. He is mildly obese. His lungs are clear. Cardiac exam regular rate and rhythm with no murmurs rubs or bruits. Breast exam shows the breast with no masses. No axillary adenopathy. Right chest wall is  well-healed mastectomy. No right chest wall nodules. The right axillary adenopathy. Abdomen soft. Mildly obese. Good bowel sounds. No palpable liver or spleen tip. Back exam no tenderness over the spine ribs or hips. Extremities no clubbing cyanosis or edema. Good strength. Good range of motion. Skin exam no rashes. Neurological exam no focal deficits.  Lab Results  Component Value Date   WBC 7.8 08/16/2014   HGB 17.3* 08/16/2014   HCT 46.7 08/16/2014   MCV 84 08/16/2014   PLT 236 08/16/2014     Chemistry      Component Value Date/Time   NA 140 01/24/2014 1506   NA 139 12/23/2012 1530   K 4.1 01/24/2014 1506   K 3.9 12/23/2012 1530   CL 106 01/24/2014 1506   CL 102 12/23/2012 1530   CO2 24 01/24/2014 1506   CO2 30 12/23/2012 1530   BUN 16 01/24/2014 1506   BUN 15 12/23/2012 1530   CREATININE 1.33 01/24/2014 1506   CREATININE 1.28 12/05/2013 1401      Component Value Date/Time   CALCIUM 9.3 01/24/2014 1506   CALCIUM 9.5 12/23/2012 1530   ALKPHOS 81 01/24/2014 1506   ALKPHOS 77 12/23/2012 1530   AST 29 01/24/2014 1506   AST 28 12/23/2012 1530   ALT 48 01/24/2014 1506   ALT 46 12/23/2012 1530  BILITOT 0.8 01/24/2014 1506   BILITOT 1.20 12/23/2012 1530         Impression and Plan: Ralph Buck is a 54 year old white male.Marland Kitchen He is male breast cancer. He had a mastectomy. We did give him adjuvant chemotherapy with Adriamycin/Cytoxan. He got 6 cycles that were completed in October of 2011.  His on Femara. He'll be on Femara for 5 years. he will finish up the Femara next year in October.  I'm very excited that his daughter got into medical school at [employer]. This is definitely a huge recognition for her and the family.  Again, he will finish up the Femara in October .  I will see him back in October.  Ralph Napoleon, MD 4/28/20165:07 PM

## 2014-08-20 ENCOUNTER — Telehealth: Payer: Self-pay | Admitting: *Deleted

## 2014-08-20 NOTE — Telephone Encounter (Signed)
-----   Message from Volanda Napoleon, MD sent at 08/17/2014  5:55 PM EDT ----- Please call and tell him that his kidney function is just below normal. I'm not sure where this stage III kidney disease comes from or why they think he has this. Thanks

## 2014-08-20 NOTE — Telephone Encounter (Signed)
Called patient and left message on personal cell phone that his kidney function is just below normal and Dr. Marin Olp is  not sure where this stage III kidney disease comes from or why they think he has this

## 2014-10-10 ENCOUNTER — Other Ambulatory Visit: Payer: Self-pay | Admitting: Hematology & Oncology

## 2014-10-10 DIAGNOSIS — C50521 Malignant neoplasm of lower-outer quadrant of right male breast: Secondary | ICD-10-CM

## 2014-10-10 MED ORDER — LETROZOLE 2.5 MG PO TABS: 2.5000 mg | ORAL_TABLET | Freq: Every day | ORAL | Status: DC

## 2014-10-10 NOTE — Addendum Note (Signed)
Addended by: Burney Gauze R on: 10/10/2014 07:05 PM   Modules accepted: Orders

## 2014-10-30 ENCOUNTER — Encounter: Payer: Self-pay | Admitting: Genetic Counselor

## 2014-11-19 ENCOUNTER — Other Ambulatory Visit: Payer: Self-pay | Admitting: Hematology & Oncology

## 2014-12-01 ENCOUNTER — Other Ambulatory Visit: Payer: Self-pay | Admitting: Hematology & Oncology

## 2015-02-14 ENCOUNTER — Ambulatory Visit: Payer: 59 | Admitting: Hematology & Oncology

## 2015-02-14 ENCOUNTER — Other Ambulatory Visit: Payer: 59

## 2015-03-20 ENCOUNTER — Ambulatory Visit (HOSPITAL_BASED_OUTPATIENT_CLINIC_OR_DEPARTMENT_OTHER): Payer: 59 | Admitting: Hematology & Oncology

## 2015-03-20 ENCOUNTER — Encounter: Payer: Self-pay | Admitting: Hematology & Oncology

## 2015-03-20 ENCOUNTER — Other Ambulatory Visit (HOSPITAL_BASED_OUTPATIENT_CLINIC_OR_DEPARTMENT_OTHER): Payer: 59

## 2015-03-20 VITALS — BP 129/89 | HR 94 | Temp 98.5°F | Resp 18 | Ht 72.0 in | Wt 266.0 lb

## 2015-03-20 DIAGNOSIS — C50521 Malignant neoplasm of lower-outer quadrant of right male breast: Secondary | ICD-10-CM | POA: Diagnosis not present

## 2015-03-20 DIAGNOSIS — Z86718 Personal history of other venous thrombosis and embolism: Secondary | ICD-10-CM | POA: Diagnosis not present

## 2015-03-20 DIAGNOSIS — M818 Other osteoporosis without current pathological fracture: Secondary | ICD-10-CM

## 2015-03-20 LAB — CBC WITH DIFFERENTIAL (CANCER CENTER ONLY)
BASO#: 0.1 10*3/uL (ref 0.0–0.2)
BASO%: 1.3 % (ref 0.0–2.0)
EOS ABS: 0.3 10*3/uL (ref 0.0–0.5)
EOS%: 4.1 % (ref 0.0–7.0)
HEMATOCRIT: 47.3 % (ref 38.7–49.9)
HEMOGLOBIN: 17.3 g/dL — AB (ref 13.0–17.1)
LYMPH#: 2.2 10*3/uL (ref 0.9–3.3)
LYMPH%: 34 % (ref 14.0–48.0)
MCH: 30.2 pg (ref 28.0–33.4)
MCHC: 36.6 g/dL — AB (ref 32.0–35.9)
MCV: 83 fL (ref 82–98)
MONO#: 0.6 10*3/uL (ref 0.1–0.9)
MONO%: 9.4 % (ref 0.0–13.0)
NEUT%: 51.2 % (ref 40.0–80.0)
NEUTROS ABS: 3.3 10*3/uL (ref 1.5–6.5)
Platelets: 231 10*3/uL (ref 145–400)
RBC: 5.73 10*6/uL — AB (ref 4.20–5.70)
RDW: 12.6 % (ref 11.1–15.7)
WBC: 6.4 10*3/uL (ref 4.0–10.0)

## 2015-03-20 LAB — COMPREHENSIVE METABOLIC PANEL (CC13)
ALBUMIN: 4 g/dL (ref 3.5–5.0)
ALT: 81 U/L — AB (ref 0–55)
AST: 49 U/L — AB (ref 5–34)
Alkaline Phosphatase: 76 U/L (ref 40–150)
Anion Gap: 7 mEq/L (ref 3–11)
BILIRUBIN TOTAL: 1.04 mg/dL (ref 0.20–1.20)
BUN: 17.6 mg/dL (ref 7.0–26.0)
CALCIUM: 9.7 mg/dL (ref 8.4–10.4)
CO2: 27 mEq/L (ref 22–29)
CREATININE: 1.5 mg/dL — AB (ref 0.7–1.3)
Chloride: 107 mEq/L (ref 98–109)
EGFR: 52 mL/min/{1.73_m2} — ABNORMAL LOW (ref >90)
Glucose: 123 mg/dl (ref 70–140)
POTASSIUM: 4.2 meq/L (ref 3.5–5.1)
Sodium: 141 mEq/L (ref 136–145)
TOTAL PROTEIN: 7.2 g/dL (ref 6.4–8.3)

## 2015-03-20 NOTE — Progress Notes (Signed)
Hematology and Oncology Follow Up Visit  SALAHUDDIN SALDUTTI [id number] [birth year] 54 y.o. 03/20/2015   Principle Diagnosis:   Stage I T1cN0M0_ ductal carcinoma of the right breast  DVT of RIGHT thigh  Current Therapy:   Femara 2.5 mg by mouth daily- completed 5 yrs 03/2015 ELIQUIS-completed 3 months of therapy    Interim History:  Mr.  Fearing is back for followup. We see him every 6 months.   He is doing pretty well. He is under quite a bit of stress at work. He works for the [employer]. His hours are very erratic. He just wonders if he may need to retire.  He is done well with the Femara. I told him that he get off the Femara for right now. I don't think 10 years of Femara would be all that much more beneficial for him.  He is now a grandfather. Apparently, his younger daughter unexpectedly Pregnant. She is in college. She had a baby boy area  He has had no problems with his legs. He did have a DVT in his right thigh. This appears to be no problem now.he's not had any problems with leg swelling.  There's been no cough or shortness of breath. He's had no change in bowel or bladder habits. There does get his mammograms.  Overall, his performance status is ECOG 0.   Medications:  Current outpatient prescriptions:  .  amLODipine (NORVASC) 5 MG tablet, Take 5 mg by mouth every morning., Disp: , Rfl: 5 .  aspirin 81 MG tablet, Take 81 mg by mouth daily. Takes 2 tab at night, Disp: , Rfl:  .  cholecalciferol (VITAMIN D) 1000 UNITS tablet, Take 2,000 Units by mouth daily.  , Disp: , Rfl:  .  losartan-hydrochlorothiazide (HYZAAR) 100-12.5 MG tablet, Take 1 tablet by mouth every morning., Disp: , Rfl: 5 .  Melatonin 3 MG TBDP, Take by mouth as needed., Disp: , Rfl:   Allergies:  Allergies  Allergen Reactions  . Amoxicillin Hives and Itching  . Penicillins Hives and Itching    Past Medical History, Surgical history, Social history, and Family History were reviewed  and updated.  He has had  Review of Systems: As above  Physical Exam:  height is 6' (1.829 m) and weight is 266 lb (120.657 kg). His oral temperature is 98.5 F (36.9 C). His blood pressure is 129/89 and his pulse is 94. His respiration is 18.   Well developed well-nourished gentleman. He is mildly obese. His lungs are clear. Cardiac exam regular rate and rhythm with no murmurs rubs or bruits. Breast exam shows the breast with no masses. No axillary adenopathy. Right chest wall is well-healed mastectomy. No right chest wall nodules. The right axillary adenopathy. Abdomen soft. Mildly obese. Good bowel sounds. No palpable liver or spleen tip. Back exam no tenderness over the spine ribs or hips. Extremities no clubbing cyanosis or edema. Good strength. Good range of motion. Skin exam no rashes. Neurological exam no focal deficits.  Lab Results  Component Value Date   WBC 6.4 03/20/2015   HGB 17.3* 03/20/2015   HCT 47.3 03/20/2015   MCV 83 03/20/2015   PLT 231 03/20/2015     Chemistry      Component Value Date/Time   NA 141 03/20/2015 1355   NA 142 08/16/2014 1504   NA 139 12/23/2012 1530   K 4.2 03/20/2015 1355   K 4.2 08/16/2014 1504   K 3.9 12/23/2012 1530   CL 107  08/16/2014 1504   CL 102 12/23/2012 1530   CO2 27 03/20/2015 1355   CO2 22 08/16/2014 1504   CO2 30 12/23/2012 1530   BUN 17.6 03/20/2015 1355   BUN 16 08/16/2014 1504   BUN 15 12/23/2012 1530   CREATININE 1.5* 03/20/2015 1355   CREATININE 1.40* 08/16/2014 1504   CREATININE 1.28 12/05/2013 1401      Component Value Date/Time   CALCIUM 9.7 03/20/2015 1355   CALCIUM 9.3 08/16/2014 1504   CALCIUM 9.5 12/23/2012 1530   ALKPHOS 76 03/20/2015 1355   ALKPHOS 99 08/16/2014 1504   ALKPHOS 77 12/23/2012 1530   AST 49* 03/20/2015 1355   AST 33 08/16/2014 1504   AST 28 12/23/2012 1530   ALT 81* 03/20/2015 1355   ALT 62* 08/16/2014 1504   ALT 46 12/23/2012 1530   BILITOT 1.04 03/20/2015 1355   BILITOT 0.8  08/16/2014 1504   BILITOT 1.20 12/23/2012 1530         Impression and Plan: Mr. Kettering is a 54 year old white male.Marland Kitchen Hehas male breast cancer. He had a mastectomy. We did give him adjuvant chemotherapy with Adriamycin/Cytoxan. He got 6 cycles that were completed in October of 2011.  Again, he'll stop the Femara when he finishes up the last bottle. This will be sometime this month.  I told make sure he takes vitamin D. I think this is very important for him. He is taking 2000 units a day.  I'll see that would have to do any scans on him.  I will plan to see him back in 6 months.  I told make sure he takes 2 baby aspirin a day. He is doing that. .  I will see him back in October.  Volanda Napoleon, MD 11/30/20165:11 PM

## 2015-03-21 ENCOUNTER — Telehealth: Payer: Self-pay

## 2015-03-21 LAB — VITAMIN D 25 HYDROXY (VIT D DEFICIENCY, FRACTURES): VIT D 25 HYDROXY: 47 ng/mL (ref 30–100)

## 2015-03-21 NOTE — Telephone Encounter (Addendum)
-----   Message from Volanda Napoleon, MD sent at 03/21/2015  6:25 AM EST ----- Call - VIt D is great!! Ralph Buck Patient called and told about his vitamin D level being great. Per Dr. Marin Olp.

## 2015-08-27 ENCOUNTER — Telehealth: Payer: Self-pay | Admitting: Hematology & Oncology

## 2015-08-27 NOTE — Telephone Encounter (Signed)
returned call and s.w. pt and confirmed appt °

## 2015-09-19 ENCOUNTER — Other Ambulatory Visit (HOSPITAL_BASED_OUTPATIENT_CLINIC_OR_DEPARTMENT_OTHER): Payer: 59

## 2015-09-19 ENCOUNTER — Ambulatory Visit (HOSPITAL_BASED_OUTPATIENT_CLINIC_OR_DEPARTMENT_OTHER): Payer: 59 | Admitting: Hematology & Oncology

## 2015-09-19 VITALS — BP 113/74 | HR 97 | Temp 98.2°F | Resp 20 | Wt 274.0 lb

## 2015-09-19 DIAGNOSIS — C50521 Malignant neoplasm of lower-outer quadrant of right male breast: Secondary | ICD-10-CM

## 2015-09-19 DIAGNOSIS — Z853 Personal history of malignant neoplasm of breast: Secondary | ICD-10-CM

## 2015-09-19 DIAGNOSIS — M818 Other osteoporosis without current pathological fracture: Secondary | ICD-10-CM

## 2015-09-19 LAB — CBC WITH DIFFERENTIAL (CANCER CENTER ONLY)
BASO#: 0.1 10*3/uL (ref 0.0–0.2)
BASO%: 1.5 % (ref 0.0–2.0)
EOS ABS: 0.2 10*3/uL (ref 0.0–0.5)
EOS%: 2.8 % (ref 0.0–7.0)
HEMATOCRIT: 40 % (ref 38.7–49.9)
HGB: 15 g/dL (ref 13.0–17.1)
LYMPH#: 1.8 10*3/uL (ref 0.9–3.3)
LYMPH%: 29.7 % (ref 14.0–48.0)
MCH: 31.6 pg (ref 28.0–33.4)
MCHC: 37.5 g/dL — ABNORMAL HIGH (ref 32.0–35.9)
MCV: 84 fL (ref 82–98)
MONO#: 0.6 10*3/uL (ref 0.1–0.9)
MONO%: 9.7 % (ref 0.0–13.0)
NEUT#: 3.4 10*3/uL (ref 1.5–6.5)
NEUT%: 56.3 % (ref 40.0–80.0)
Platelets: 240 10*3/uL (ref 145–400)
RBC: 4.74 10*6/uL (ref 4.20–5.70)
RDW: 12.4 % (ref 11.1–15.7)
WBC: 6.1 10*3/uL (ref 4.0–10.0)

## 2015-09-19 LAB — COMPREHENSIVE METABOLIC PANEL (CC13)
ALK PHOS: 82 IU/L (ref 39–117)
ALT: 62 IU/L — ABNORMAL HIGH (ref 0–44)
AST (SGOT): 39 IU/L (ref 0–40)
Albumin, Serum: 4.1 g/dL (ref 3.5–5.5)
Albumin/Globulin Ratio: 1.5 (ref 1.2–2.2)
BUN/Creatinine Ratio: 15 (ref 9–20)
BUN: 18 mg/dL (ref 6–24)
Bilirubin Total: 0.6 mg/dL (ref 0.0–1.2)
CALCIUM: 9.1 mg/dL (ref 8.7–10.2)
CO2: 23 mmol/L (ref 18–29)
CREATININE: 1.17 mg/dL (ref 0.76–1.27)
Chloride, Ser: 106 mmol/L (ref 96–106)
GFR, EST AFRICAN AMERICAN: 81 mL/min/{1.73_m2} (ref >59)
GFR, EST NON AFRICAN AMERICAN: 70 mL/min/{1.73_m2} (ref >59)
GLOBULIN, TOTAL: 2.8 g/dL (ref 1.5–4.5)
GLUCOSE: 138 mg/dL — AB (ref 65–99)
Potassium, Ser: 3.8 mmol/L (ref 3.5–5.2)
SODIUM: 137 mmol/L (ref 134–144)
TOTAL PROTEIN: 6.9 g/dL (ref 6.0–8.5)

## 2015-09-19 NOTE — Progress Notes (Signed)
Hematology and Oncology Follow Up Visit  KHARON SCHRAER [id number] [birth year] 55 y.o. 09/19/2015   Principle Diagnosis:   Stage I T1cN0M0_ ductal carcinoma of the right breast  DVT of RIGHT thigh  Current Therapy:   Observation    Interim History:  Mr.  Narro is back for followup. We see him every 6 months.   He is doing pretty well. He is under quite a bit of stress at work. He works for the [employer]. His hours are very erratic. He is still trying to do his best to help out those families at her not to fortunate.   He is doing well otherwise. He's had no cough. He's had a shortness of breath. He's had no change in bowel or bladder habits.  He's had no rashes. He's had a leg swelling. He's had no weight loss or weight gain. He's tried exercise but this is difficult because of his work.   His grandchild is grown up. He is 19 months old. He seems to be doing pretty well. He does take his vitamin D and baby aspirin. I did this to be very helpful.  Hopefully, he'll be able to have a little bit of a vacation this summer.  Overall, his performance status is ECOG 0.   Medications:  Current outpatient prescriptions:  .  amLODipine (NORVASC) 5 MG tablet, Take 5 mg by mouth every morning., Disp: , Rfl: 5 .  aspirin 81 MG tablet, Take 81 mg by mouth daily. Takes 2 tab at night, Disp: , Rfl:  .  cholecalciferol (VITAMIN D) 1000 UNITS tablet, Take 2,000 Units by mouth daily.  , Disp: , Rfl:  .  losartan-hydrochlorothiazide (HYZAAR) 100-12.5 MG tablet, Take 1 tablet by mouth every morning., Disp: , Rfl: 5  Allergies:  Allergies  Allergen Reactions  . Amoxicillin Hives and Itching  . Penicillins Hives and Itching    Past Medical History, Surgical history, Social history, and Family History were reviewed and updated.  He has had  Review of Systems: As above  Physical Exam:  weight is 274 lb (124.286 kg). His oral temperature is 98.2 F (36.8 C). His blood  pressure is 113/74 and his pulse is 97. His respiration is 20.   Well developed well-nourished gentleman. Head and neck exam shows no ocular or oral lesions. He has no palpable thyroid. He has no palpable adenopathy in his neck. He is mildly obese. His lungs are clear. Cardiac exam regular rate and rhythm with no murmurs rubs or bruits. Breast exam shows the left breast with no masses. There is no left axillary adenopathy. Right chest wall shows a well-healed mastectomy. No right chest wall nodules are noted. There is no right axillary adenopathy. Abdomen is soft and mildly obese. Good bowel sounds. No palpable liver or spleen tip. Back exam shows no tenderness over the spine ribs or hips. Extremities shows no clubbing cyanosis or edema. Good strength in his extremities. Good range of motion. Skin exam shows no rashes, ecchymoses or petechia. Neurological exam no focal deficits.  Lab Results  Component Value Date   WBC 6.1 09/19/2015   HGB 15.0 09/19/2015   HCT 40.0 09/19/2015   MCV 84 09/19/2015   PLT 240 09/19/2015     Chemistry      Component Value Date/Time   NA 137 09/19/2015 1458   NA 141 03/20/2015 1355   NA 142 08/16/2014 1504   NA 139 12/23/2012 1530   K 3.8 09/19/2015 1458  K 4.2 03/20/2015 1355   K 4.2 08/16/2014 1504   K 3.9 12/23/2012 1530   CL 106 09/19/2015 1458   CL 107 08/16/2014 1504   CL 102 12/23/2012 1530   CO2 23 09/19/2015 1458   CO2 27 03/20/2015 1355   CO2 22 08/16/2014 1504   CO2 30 12/23/2012 1530   BUN 18 09/19/2015 1458   BUN 17.6 03/20/2015 1355   BUN 16 08/16/2014 1504   BUN 15 12/23/2012 1530   CREATININE 1.17 09/19/2015 1458   CREATININE 1.5* 03/20/2015 1355   CREATININE 1.40* 08/16/2014 1504   CREATININE 1.28 12/05/2013 1401      Component Value Date/Time   CALCIUM 9.1 09/19/2015 1458   CALCIUM 9.7 03/20/2015 1355   CALCIUM 9.3 08/16/2014 1504   CALCIUM 9.5 12/23/2012 1530   ALKPHOS 82 09/19/2015 1458   ALKPHOS 76 03/20/2015 1355    ALKPHOS 99 08/16/2014 1504   ALKPHOS 77 12/23/2012 1530   AST 39 09/19/2015 1458   AST 49* 03/20/2015 1355   AST 33 08/16/2014 1504   AST 28 12/23/2012 1530   ALT 62* 09/19/2015 1458   ALT 81* 03/20/2015 1355   ALT 62* 08/16/2014 1504   ALT 46 12/23/2012 1530   BILITOT 0.6 09/19/2015 1458   BILITOT 1.04 03/20/2015 1355   BILITOT 0.8 08/16/2014 1504   BILITOT 1.20 12/23/2012 1530         Impression and Plan: Mr. Dema is a 55 year old white male.Marland Kitchen He has male breast cancer. He had a mastectomy. We did give him adjuvant chemotherapy with Adriamycin/Cytoxan. He got 6 cycles that were completed in October of 2011.  Everything looks okay from my point of view. I don't see any recurrence of his breast cancer.   We have Teson for the BCRA genetic mutation. He is negative for this.  His liver tests are looking okay. He may have a little bit of a fatty liver.  We will plan to can do back in 6 months for right now.    Volanda Napoleon, MD 6/1/20176:18 PM

## 2015-09-20 ENCOUNTER — Encounter: Payer: Self-pay | Admitting: Nurse Practitioner

## 2015-09-20 LAB — VITAMIN D 25 HYDROXY (VIT D DEFICIENCY, FRACTURES): VIT D 25 HYDROXY: 39.9 ng/mL (ref 30.0–100.0)

## 2015-10-11 ENCOUNTER — Encounter: Payer: Self-pay | Admitting: Genetic Counselor

## 2016-03-20 ENCOUNTER — Other Ambulatory Visit (HOSPITAL_BASED_OUTPATIENT_CLINIC_OR_DEPARTMENT_OTHER): Payer: 59

## 2016-03-20 ENCOUNTER — Ambulatory Visit (HOSPITAL_BASED_OUTPATIENT_CLINIC_OR_DEPARTMENT_OTHER): Payer: 59 | Admitting: Hematology & Oncology

## 2016-03-20 VITALS — BP 130/82 | HR 96 | Temp 98.3°F | Resp 30 | Wt 272.4 lb

## 2016-03-20 DIAGNOSIS — M818 Other osteoporosis without current pathological fracture: Secondary | ICD-10-CM

## 2016-03-20 DIAGNOSIS — Z853 Personal history of malignant neoplasm of breast: Secondary | ICD-10-CM

## 2016-03-20 DIAGNOSIS — C50521 Malignant neoplasm of lower-outer quadrant of right male breast: Secondary | ICD-10-CM

## 2016-03-20 DIAGNOSIS — Z170 Estrogen receptor positive status [ER+]: Principal | ICD-10-CM

## 2016-03-20 LAB — CBC WITH DIFFERENTIAL (CANCER CENTER ONLY)
BASO#: 0.1 10*3/uL (ref 0.0–0.2)
BASO%: 0.9 % (ref 0.0–2.0)
EOS ABS: 0.2 10*3/uL (ref 0.0–0.5)
EOS%: 2.3 % (ref 0.0–7.0)
HEMATOCRIT: 40.2 % (ref 38.7–49.9)
HEMOGLOBIN: 15 g/dL (ref 13.0–17.1)
LYMPH#: 2.3 10*3/uL (ref 0.9–3.3)
LYMPH%: 27.8 % (ref 14.0–48.0)
MCH: 31.1 pg (ref 28.0–33.4)
MCHC: 37.3 g/dL — AB (ref 32.0–35.9)
MCV: 83 fL (ref 82–98)
MONO#: 0.6 10*3/uL (ref 0.1–0.9)
MONO%: 6.7 % (ref 0.0–13.0)
NEUT#: 5.1 10*3/uL (ref 1.5–6.5)
NEUT%: 62.3 % (ref 40.0–80.0)
Platelets: 215 10*3/uL (ref 145–400)
RBC: 4.83 10*6/uL (ref 4.20–5.70)
RDW: 12.3 % (ref 11.1–15.7)
WBC: 8.2 10*3/uL (ref 4.0–10.0)

## 2016-03-20 LAB — COMPREHENSIVE METABOLIC PANEL (CC13)
A/G RATIO: 1.4 (ref 1.2–2.2)
ALBUMIN: 4.1 g/dL (ref 3.5–5.5)
ALT: 51 IU/L — ABNORMAL HIGH (ref 0–44)
AST (SGOT): 32 IU/L (ref 0–40)
Alkaline Phosphatase, S: 97 IU/L (ref 39–117)
BILIRUBIN TOTAL: 0.6 mg/dL (ref 0.0–1.2)
BUN / CREAT RATIO: 11 (ref 9–20)
BUN: 13 mg/dL (ref 6–24)
CHLORIDE: 102 mmol/L (ref 96–106)
Calcium, Ser: 9.5 mg/dL (ref 8.7–10.2)
Carbon Dioxide, Total: 22 mmol/L (ref 18–29)
Creatinine, Ser: 1.15 mg/dL (ref 0.76–1.27)
GFR calc non Af Amer: 71 mL/min/{1.73_m2} (ref >59)
GFR, EST AFRICAN AMERICAN: 82 mL/min/{1.73_m2} (ref >59)
GLUCOSE: 179 mg/dL — AB (ref 65–99)
Globulin, Total: 3 g/dL (ref 1.5–4.5)
Potassium, Ser: 3.6 mmol/L (ref 3.5–5.2)
Sodium: 133 mmol/L — ABNORMAL LOW (ref 134–144)
TOTAL PROTEIN: 7.2 g/dL (ref 6.0–8.5)

## 2016-03-20 NOTE — Progress Notes (Signed)
This is a) a year or as Hematology and Oncology Follow Up Visit  Ralph Buck [id number] 1960-09-15 55 y.o. 03/20/2016   Principle Diagnosis:   Stage I T1cN0M0_ ductal carcinoma of the right breast  DVT of RIGHT thigh  Current Therapy:   Observation    Interim History:  Ralph Buck is back for followup. We see him every 6 months.  He is doing pretty well. He is under quite a bit of stress at work. He works for the [employer]. Thankfully, he is in a new department. The stress is not as bad.  He had a pretty uneventful summer. His only grandson turned I-year-old recently. The had a nice party for him. His daughter, who is a mother of the grandson, is moving to [place].   He is doing well otherwise. He's had no cough. He's had no shortness of breath. He's had no change in bowel or bladder habits.  He's had no rashes. He's had a leg swelling. He's had no weight loss or weight gain. He is trying to exercise but this is difficult because of his work.   He recently had surgery for the fifth finger on his left hand. This is from an old fracture that this never healed up proper.  He does take his vitamin D and baby aspirin. I did this to be very helpful.  Overall, his performance status is ECOG 0.   Medications:  Current Outpatient Prescriptions:  .  amLODipine (NORVASC) 5 MG tablet, Take 5 mg by mouth every morning., Disp: , Rfl: 5 .  cholecalciferol (VITAMIN D) 1000 UNITS tablet, Take 2,000 Units by mouth daily.  , Disp: , Rfl:  .  losartan-hydrochlorothiazide (HYZAAR) 100-12.5 MG tablet, Take 1 tablet by mouth every morning., Disp: , Rfl: 5 .  aspirin 81 MG tablet, Take 81 mg by mouth daily. Takes 2 tab at night, Disp: , Rfl:   Allergies:  Allergies  Allergen Reactions  . Amoxicillin Hives and Itching  . Penicillins Hives and Itching    Past Medical History, Surgical history, Social history, and Family History were reviewed and updated.  He has  had  Review of Systems: As above  Physical Exam:  weight is 272 lb 6.4 oz (123.6 kg). His oral temperature is 98.3 F (36.8 C). His blood pressure is 130/82 and his pulse is 96. His respiration is 30 (abnormal).   Well developed well-nourished gentleman. Head and neck exam shows no ocular or oral lesions. He has no palpable thyroid. He has no palpable adenopathy in his neck. He is mildly obese. His lungs are clear. Cardiac exam regular rate and rhythm with no murmurs rubs or bruits. Breast exam shows the left breast with no masses. There is no left axillary adenopathy. Right chest wall shows a well-healed mastectomy. No right chest wall nodules are noted. There is no right axillary adenopathy. Abdomen is soft and mildly obese. Good bowel sounds. No palpable liver or spleen tip. Back exam shows no tenderness over the spine ribs or hips. Extremities shows no clubbing cyanosis or edema. Good strength in his extremities. Good range of motion. Skin exam shows no rashes, ecchymoses or petechia. Neurological exam no focal deficits.  Lab Results  Component Value Date   WBC 8.2 03/20/2016   HGB 15.0 03/20/2016   HCT 40.2 03/20/2016   MCV 83 03/20/2016   PLT 215 03/20/2016     Chemistry      Component Value Date/Time   NA 137  09/19/2015 1458   NA 141 03/20/2015 1355   K 3.8 09/19/2015 1458   K 4.2 03/20/2015 1355   CL 106 09/19/2015 1458   CL 102 12/23/2012 1530   CO2 23 09/19/2015 1458   CO2 27 03/20/2015 1355   BUN 18 09/19/2015 1458   BUN 17.6 03/20/2015 1355   CREATININE 1.17 09/19/2015 1458   CREATININE 1.5 (H) 03/20/2015 1355      Component Value Date/Time   CALCIUM 9.1 09/19/2015 1458   CALCIUM 9.7 03/20/2015 1355   ALKPHOS 82 09/19/2015 1458   ALKPHOS 76 03/20/2015 1355   AST 39 09/19/2015 1458   AST 49 (H) 03/20/2015 1355   ALT 62 (H) 09/19/2015 1458   ALT 81 (H) 03/20/2015 1355   BILITOT 0.6 09/19/2015 1458   BILITOT 1.04 03/20/2015 1355         Impression and  Plan: Ralph Buck is a 55 year old white male.Marland Kitchen He has male breast cancer. He had a mastectomy.This was in July 2011. We did give him adjuvant chemotherapy with Adriamycin/Cytoxan. He got 6 cycles that were completed in October of 2011.  Everything looks okay from my point of view. I don't see any recurrence of his breast cancer.   We have tested him for the BCRA genetic mutation. He is negative for this.  We will plan to can do back in 6 months for right now.    Volanda Napoleon, MD 12/1/20173:51 PM

## 2016-03-21 LAB — VITAMIN D 25 HYDROXY (VIT D DEFICIENCY, FRACTURES): Vitamin D, 25-Hydroxy: 50.9 ng/mL (ref 30.0–100.0)

## 2016-03-23 ENCOUNTER — Telehealth: Payer: Self-pay | Admitting: *Deleted

## 2016-03-23 ENCOUNTER — Encounter: Payer: Self-pay | Admitting: *Deleted

## 2016-03-23 NOTE — Telephone Encounter (Addendum)
 -----   Message from Volanda Napoleon, MD sent at 03/21/2016  7:58 AM EST ----- Call - vit D level is ok!!  Blood sugar is on the high side!!!  pete

## 2016-07-28 DIAGNOSIS — Z0000 Encounter for general adult medical examination without abnormal findings: Secondary | ICD-10-CM | POA: Diagnosis not present

## 2016-07-28 DIAGNOSIS — Z125 Encounter for screening for malignant neoplasm of prostate: Secondary | ICD-10-CM | POA: Diagnosis not present

## 2016-07-28 DIAGNOSIS — I129 Hypertensive chronic kidney disease with stage 1 through stage 4 chronic kidney disease, or unspecified chronic kidney disease: Secondary | ICD-10-CM | POA: Diagnosis not present

## 2016-07-28 DIAGNOSIS — R740 Nonspecific elevation of levels of transaminase and lactic acid dehydrogenase [LDH]: Secondary | ICD-10-CM | POA: Diagnosis not present

## 2016-07-28 DIAGNOSIS — N183 Chronic kidney disease, stage 3 (moderate): Secondary | ICD-10-CM | POA: Diagnosis not present

## 2016-08-11 DIAGNOSIS — R740 Nonspecific elevation of levels of transaminase and lactic acid dehydrogenase [LDH]: Secondary | ICD-10-CM | POA: Diagnosis not present

## 2016-08-11 DIAGNOSIS — S70362A Insect bite (nonvenomous), left thigh, initial encounter: Secondary | ICD-10-CM | POA: Diagnosis not present

## 2016-09-18 ENCOUNTER — Other Ambulatory Visit (HOSPITAL_BASED_OUTPATIENT_CLINIC_OR_DEPARTMENT_OTHER): Payer: 59

## 2016-09-18 ENCOUNTER — Ambulatory Visit (HOSPITAL_BASED_OUTPATIENT_CLINIC_OR_DEPARTMENT_OTHER): Payer: 59 | Admitting: Hematology & Oncology

## 2016-09-18 VITALS — BP 118/86 | HR 67 | Temp 98.3°F | Resp 19 | Wt 277.5 lb

## 2016-09-18 DIAGNOSIS — C50521 Malignant neoplasm of lower-outer quadrant of right male breast: Secondary | ICD-10-CM

## 2016-09-18 DIAGNOSIS — Z853 Personal history of malignant neoplasm of breast: Secondary | ICD-10-CM | POA: Diagnosis not present

## 2016-09-18 DIAGNOSIS — Z170 Estrogen receptor positive status [ER+]: Principal | ICD-10-CM

## 2016-09-18 DIAGNOSIS — Z7689 Persons encountering health services in other specified circumstances: Secondary | ICD-10-CM | POA: Diagnosis not present

## 2016-09-18 DIAGNOSIS — M818 Other osteoporosis without current pathological fracture: Secondary | ICD-10-CM

## 2016-09-18 LAB — CBC WITH DIFFERENTIAL (CANCER CENTER ONLY)
BASO#: 0.1 10*3/uL (ref 0.0–0.2)
BASO%: 1 % (ref 0.0–2.0)
EOS ABS: 0.3 10*3/uL (ref 0.0–0.5)
EOS%: 4.3 % (ref 0.0–7.0)
HEMATOCRIT: 42.8 % (ref 38.7–49.9)
HGB: 15.8 g/dL (ref 13.0–17.1)
LYMPH#: 2.5 10*3/uL (ref 0.9–3.3)
LYMPH%: 31.7 % (ref 14.0–48.0)
MCH: 31 pg (ref 28.0–33.4)
MCHC: 36.9 g/dL — ABNORMAL HIGH (ref 32.0–35.9)
MCV: 84 fL (ref 82–98)
MONO#: 0.7 10*3/uL (ref 0.1–0.9)
MONO%: 9.2 % (ref 0.0–13.0)
NEUT#: 4.3 10*3/uL (ref 1.5–6.5)
NEUT%: 53.8 % (ref 40.0–80.0)
PLATELETS: 282 10*3/uL (ref 145–400)
RBC: 5.09 10*6/uL (ref 4.20–5.70)
RDW: 12.6 % (ref 11.1–15.7)
WBC: 7.9 10*3/uL (ref 4.0–10.0)

## 2016-09-18 LAB — COMPREHENSIVE METABOLIC PANEL (CC13)
A/G RATIO: 1.3 (ref 1.2–2.2)
ALT: 55 IU/L — ABNORMAL HIGH (ref 0–44)
AST (SGOT): 35 IU/L (ref 0–40)
Albumin, Serum: 4.1 g/dL (ref 3.5–5.5)
Alkaline Phosphatase, S: 87 IU/L (ref 39–117)
BILIRUBIN TOTAL: 0.4 mg/dL (ref 0.0–1.2)
BUN/Creatinine Ratio: 14 (ref 9–20)
BUN: 19 mg/dL (ref 6–24)
CALCIUM: 9.3 mg/dL (ref 8.7–10.2)
CHLORIDE: 104 mmol/L (ref 96–106)
Carbon Dioxide, Total: 24 mmol/L (ref 18–29)
Creatinine, Ser: 1.38 mg/dL — ABNORMAL HIGH (ref 0.76–1.27)
GFR, EST AFRICAN AMERICAN: 66 mL/min/{1.73_m2} (ref >59)
GFR, EST NON AFRICAN AMERICAN: 57 mL/min/{1.73_m2} — AB (ref >59)
GLOBULIN, TOTAL: 3.2 g/dL (ref 1.5–4.5)
Glucose: 130 mg/dL — ABNORMAL HIGH (ref 65–99)
POTASSIUM: 3.9 mmol/L (ref 3.5–5.2)
SODIUM: 136 mmol/L (ref 134–144)
Total Protein: 7.3 g/dL (ref 6.0–8.5)

## 2016-09-18 NOTE — Progress Notes (Signed)
This is a) a year or as Hematology and Oncology Follow Up Visit  Ralph Buck [id number] February 13, 1961 56 y.o. 09/18/2016   Principle Diagnosis:   Stage I T1cN0M0_ ductal carcinoma of the right breast  DVT of RIGHT thigh  Current Therapy:   Observation    Interim History:  Mr.  Buck is back for followup. He is doing okay. He still has not lost any weight.  His grandson is now a urinary half. He showed me a picture. He is certainly growing up.  His 2 daughters are doing well. His younger daughter is doing rotations down in [place] for medical school. His older daughter is in the [employer].  He has had no problems with fatigue or weakness. He's had no nausea or vomiting. He's had no abdominal pain. He's had no change in bowel or bladder habits.   We have been watching his liver function studies. They have been a little bit elevated. I think he probably is developing a fatty liver.  He does have a past history of abnormal metabolic disease in the right leg. He does take aspirin. He has had no problems with leg swelling or leg pain.   He's had no rashes.  He's had no fever. He got to the wintertime without influenza.  Overall, his performance status is ECOG 0.   Medications:  Current Outpatient Prescriptions:  .  amLODipine (NORVASC) 5 MG tablet, Take 5 mg by mouth every morning., Disp: , Rfl: 5 .  aspirin 81 MG tablet, Take 81 mg by mouth daily. Takes 2 tab at night, Disp: , Rfl:  .  cholecalciferol (VITAMIN D) 1000 UNITS tablet, Take 2,000 Units by mouth daily.  , Disp: , Rfl:  .  losartan-hydrochlorothiazide (HYZAAR) 100-12.5 MG tablet, Take 1 tablet by mouth every morning., Disp: , Rfl: 5  Allergies:  Allergies  Allergen Reactions  . Amoxicillin Hives and Itching  . Penicillins Hives and Itching    Past Medical History, Surgical history, Social history, and Family History were reviewed and updated.  He has had  Review of Systems: As above  Physical  Exam:  weight is 277 lb 8 oz (125.9 kg). His oral temperature is 98.3 F (36.8 C). His blood pressure is 118/86 and his pulse is 67. His respiration is 19 and oxygen saturation is 99%.   Well developed well-nourished gentleman. Head and neck exam shows no ocular or oral lesions. He has no palpable thyroid. He has no palpable adenopathy in his neck. He is mildly obese. His lungs are clear. Cardiac exam regular rate and rhythm with no murmurs rubs or bruits. Breast exam shows the left breast with no masses. There is no left axillary adenopathy. Right chest wall shows a well-healed mastectomy. No right chest wall nodules are noted. There is no right axillary adenopathy. Abdomen is soft and mildly obese. Good bowel sounds. No palpable liver or spleen tip. Back exam shows no tenderness over the spine ribs or hips. Extremities shows no clubbing cyanosis or edema. Good strength in his extremities. Good range of motion. Skin exam shows no rashes, ecchymoses or petechia. Neurological exam no focal deficits.  Lab Results  Component Value Date   WBC 7.9 09/18/2016   HGB 15.8 09/18/2016   HCT 42.8 09/18/2016   MCV 84 09/18/2016   PLT 282 09/18/2016     Chemistry      Component Value Date/Time   NA 133 (L) 03/20/2016 1456   NA 141 03/20/2015 1355   K  3.6 03/20/2016 1456   K 4.2 03/20/2015 1355   CL 102 03/20/2016 1456   CL 102 12/23/2012 1530   CO2 22 03/20/2016 1456   CO2 27 03/20/2015 1355   BUN 13 03/20/2016 1456   BUN 17.6 03/20/2015 1355   CREATININE 1.15 03/20/2016 1456   CREATININE 1.5 (H) 03/20/2015 1355      Component Value Date/Time   CALCIUM 9.5 03/20/2016 1456   CALCIUM 9.7 03/20/2015 1355   ALKPHOS 97 03/20/2016 1456   ALKPHOS 76 03/20/2015 1355   AST 32 03/20/2016 1456   AST 49 (H) 03/20/2015 1355   ALT 51 (H) 03/20/2016 1456   ALT 81 (H) 03/20/2015 1355   BILITOT 0.6 03/20/2016 1456   BILITOT 1.04 03/20/2015 1355         Impression and Plan: Ralph Buck is a  56 year old white male.Marland Kitchen He has male breast cancer. He had a mastectomy.This was in July 2011. We did give him adjuvant chemotherapy with Adriamycin/Cytoxan. He got 6 cycles that were completed in October of 2011.  Everything looks okay from my point of view. I don't see any recurrence of his breast cancer.   We have tested him for the BCRA genetic mutation. He is negative for this.  I think that we can now get him back yearly. I just think that the risk of recurrence for him is going to be less than 10%.  I do want to make sure that we check his PSA when we see him back.  It sounds like he is going try to retire in a couple years.   Ralph Napoleon, MD 6/1/20184:06 PM

## 2016-09-19 LAB — VITAMIN D 25 HYDROXY (VIT D DEFICIENCY, FRACTURES): VIT D 25 HYDROXY: 37 ng/mL (ref 30.0–100.0)

## 2016-09-21 ENCOUNTER — Telehealth: Payer: Self-pay | Admitting: *Deleted

## 2016-09-21 NOTE — Telephone Encounter (Addendum)
Patient is aware of results. Results sent to PCP  ----- Message from Volanda Napoleon, MD sent at 09/18/2016  5:53 PM EDT ----- Call - kidney function is down a little.  You really need to stay hydrated.  Please fax these results to his family MD!!  Dierdre Searles, MD  [facility] Nurse [facility]        Call - vit D level is ok!!! pete

## 2016-10-19 DIAGNOSIS — J320 Chronic maxillary sinusitis: Secondary | ICD-10-CM | POA: Diagnosis not present

## 2016-10-19 DIAGNOSIS — J069 Acute upper respiratory infection, unspecified: Secondary | ICD-10-CM | POA: Diagnosis not present

## 2016-10-19 DIAGNOSIS — Z853 Personal history of malignant neoplasm of breast: Secondary | ICD-10-CM | POA: Diagnosis not present

## 2016-10-27 DIAGNOSIS — N6320 Unspecified lump in the left breast, unspecified quadrant: Secondary | ICD-10-CM | POA: Diagnosis not present

## 2016-10-27 DIAGNOSIS — Z853 Personal history of malignant neoplasm of breast: Secondary | ICD-10-CM | POA: Diagnosis not present

## 2017-02-08 DIAGNOSIS — I10 Essential (primary) hypertension: Secondary | ICD-10-CM | POA: Diagnosis not present

## 2017-02-08 DIAGNOSIS — J302 Other seasonal allergic rhinitis: Secondary | ICD-10-CM | POA: Diagnosis not present

## 2017-02-08 DIAGNOSIS — N644 Mastodynia: Secondary | ICD-10-CM | POA: Diagnosis not present

## 2017-04-02 DIAGNOSIS — D1739 Benign lipomatous neoplasm of skin and subcutaneous tissue of other sites: Secondary | ICD-10-CM | POA: Diagnosis not present

## 2017-04-02 DIAGNOSIS — Z853 Personal history of malignant neoplasm of breast: Secondary | ICD-10-CM | POA: Diagnosis not present

## 2017-04-07 DIAGNOSIS — H524 Presbyopia: Secondary | ICD-10-CM | POA: Diagnosis not present

## 2017-05-11 DIAGNOSIS — B078 Other viral warts: Secondary | ICD-10-CM | POA: Diagnosis not present

## 2017-05-11 DIAGNOSIS — L821 Other seborrheic keratosis: Secondary | ICD-10-CM | POA: Diagnosis not present

## 2017-05-11 DIAGNOSIS — D225 Melanocytic nevi of trunk: Secondary | ICD-10-CM | POA: Diagnosis not present

## 2017-08-03 DIAGNOSIS — Z0000 Encounter for general adult medical examination without abnormal findings: Secondary | ICD-10-CM | POA: Diagnosis not present

## 2017-08-03 DIAGNOSIS — N183 Chronic kidney disease, stage 3 (moderate): Secondary | ICD-10-CM | POA: Diagnosis not present

## 2017-08-03 DIAGNOSIS — I129 Hypertensive chronic kidney disease with stage 1 through stage 4 chronic kidney disease, or unspecified chronic kidney disease: Secondary | ICD-10-CM | POA: Diagnosis not present

## 2017-08-04 DIAGNOSIS — I129 Hypertensive chronic kidney disease with stage 1 through stage 4 chronic kidney disease, or unspecified chronic kidney disease: Secondary | ICD-10-CM | POA: Diagnosis not present

## 2017-08-04 DIAGNOSIS — Z0000 Encounter for general adult medical examination without abnormal findings: Secondary | ICD-10-CM | POA: Diagnosis not present

## 2017-09-23 ENCOUNTER — Other Ambulatory Visit: Payer: Self-pay | Admitting: *Deleted

## 2017-09-23 DIAGNOSIS — C50521 Malignant neoplasm of lower-outer quadrant of right male breast: Secondary | ICD-10-CM

## 2017-09-24 ENCOUNTER — Inpatient Hospital Stay: Payer: 59 | Attending: Hematology & Oncology

## 2017-09-24 ENCOUNTER — Inpatient Hospital Stay (HOSPITAL_BASED_OUTPATIENT_CLINIC_OR_DEPARTMENT_OTHER): Payer: 59 | Admitting: Hematology & Oncology

## 2017-09-24 ENCOUNTER — Encounter: Payer: Self-pay | Admitting: Hematology & Oncology

## 2017-09-24 ENCOUNTER — Other Ambulatory Visit: Payer: Self-pay

## 2017-09-24 VITALS — BP 144/87 | HR 80 | Temp 98.8°F | Resp 18 | Wt 288.0 lb

## 2017-09-24 DIAGNOSIS — Z170 Estrogen receptor positive status [ER+]: Secondary | ICD-10-CM

## 2017-09-24 DIAGNOSIS — C50521 Malignant neoplasm of lower-outer quadrant of right male breast: Secondary | ICD-10-CM | POA: Diagnosis not present

## 2017-09-24 DIAGNOSIS — Z9010 Acquired absence of unspecified breast and nipple: Secondary | ICD-10-CM | POA: Insufficient documentation

## 2017-09-24 DIAGNOSIS — Z853 Personal history of malignant neoplasm of breast: Secondary | ICD-10-CM | POA: Diagnosis present

## 2017-09-24 DIAGNOSIS — Z9221 Personal history of antineoplastic chemotherapy: Secondary | ICD-10-CM | POA: Insufficient documentation

## 2017-09-24 LAB — CBC WITH DIFFERENTIAL (CANCER CENTER ONLY)
BASOS PCT: 1 %
Basophils Absolute: 0.1 10*3/uL (ref 0.0–0.1)
EOS ABS: 0.1 10*3/uL (ref 0.0–0.5)
Eosinophils Relative: 2 %
HCT: 43.2 % (ref 38.7–49.9)
HEMOGLOBIN: 15.6 g/dL (ref 13.0–17.1)
Lymphocytes Relative: 29 %
Lymphs Abs: 2.2 10*3/uL (ref 0.9–3.3)
MCH: 30.4 pg (ref 28.0–33.4)
MCHC: 36.1 g/dL — AB (ref 32.0–35.9)
MCV: 84.2 fL (ref 82.0–98.0)
MONO ABS: 0.6 10*3/uL (ref 0.1–0.9)
MONOS PCT: 8 %
NEUTROS PCT: 60 %
Neutro Abs: 4.6 10*3/uL (ref 1.5–6.5)
PLATELETS: 215 10*3/uL (ref 145–400)
RBC: 5.13 MIL/uL (ref 4.20–5.70)
RDW: 12.6 % (ref 11.1–15.7)
WBC Count: 7.5 10*3/uL (ref 4.0–10.0)

## 2017-09-24 LAB — CMP (CANCER CENTER ONLY)
ALT: 56 U/L — ABNORMAL HIGH (ref 10–47)
AST: 40 U/L — ABNORMAL HIGH (ref 11–38)
Albumin: 3.6 g/dL (ref 3.5–5.0)
Alkaline Phosphatase: 81 U/L (ref 26–84)
Anion gap: 7 (ref 5–15)
BILIRUBIN TOTAL: 0.8 mg/dL (ref 0.2–1.6)
BUN: 13 mg/dL (ref 7–22)
CO2: 27 mmol/L (ref 18–33)
CREATININE: 1.2 mg/dL (ref 0.60–1.20)
Calcium: 9.1 mg/dL (ref 8.0–10.3)
Chloride: 105 mmol/L (ref 98–108)
Glucose, Bld: 176 mg/dL — ABNORMAL HIGH (ref 73–118)
Potassium: 3.8 mmol/L (ref 3.3–4.7)
Sodium: 139 mmol/L (ref 128–145)
Total Protein: 6.8 g/dL (ref 6.4–8.1)

## 2017-09-24 NOTE — Progress Notes (Signed)
This is a) a year or as Hematology and Oncology Follow Up Visit  KYSTON GONCE [id number] 07-15-1960 57 y.o. 09/24/2017   Principle Diagnosis:   Stage I T1cN0M0_ ductal carcinoma of the right breast  DVT of RIGHT thigh  Current Therapy:   Observation    Interim History:  Mr.  Hammen is back for followup. He is doing okay. He still has not lost any weight.  His grandson is now a urinary half. He showed me a picture. He is certainly growing up.  His 2 daughters are doing well. His younger daughter is doing rotations down in [place] for medical school. His older daughter is in the [employer].  He has had no problems with fatigue or weakness. He's had no nausea or vomiting. He's had no abdominal pain. He's had no change in bowel or bladder habits.   We have been watching his liver function studies. They have been a little bit elevated. I think he probably is developing a fatty liver.  He does have a past history of thromboembolic disease in the right leg. He does take aspirin. He has had no problems with leg swelling or leg pain.   He's had no rashes.  He's had no fever. He got to the wintertime without influenza.  Overall, his performance status is ECOG 0.   Medications:  Current Outpatient Medications:  .  aspirin 81 MG tablet, Take 81 mg by mouth daily. Takes 2 tab at night, Disp: , Rfl:  .  cholecalciferol (VITAMIN D) 1000 UNITS tablet, Take 2,000 Units by mouth daily.  , Disp: , Rfl:   Allergies:  Allergies  Allergen Reactions  . Amoxicillin Hives and Itching  . Penicillins Hives and Itching    Past Medical History, Surgical history, Social history, and Family History were reviewed and updated.    Review of Systems: Review of Systems  Constitutional: Negative.   HENT: Negative.   Eyes: Negative.   Respiratory: Negative.   Cardiovascular: Negative.   Gastrointestinal: Negative.   Genitourinary: Negative.   Musculoskeletal: Negative.   Skin:  Negative.   Neurological: Negative.   Endo/Heme/Allergies: Negative.   Psychiatric/Behavioral: Negative.      Physical Exam:  weight is 288 lb (130.6 kg). His oral temperature is 98.8 F (37.1 C). His blood pressure is 144/87 (abnormal) and his pulse is 80. His respiration is 18 and oxygen saturation is 98%.   Physical Exam  Constitutional: He is oriented to person, place, and time.  HENT:  Head: Normocephalic and atraumatic.  Mouth/Throat: Oropharynx is clear and moist.  Eyes: Pupils are equal, round, and reactive to light. EOM are normal.  Neck: Normal range of motion.  Cardiovascular: Normal rate, regular rhythm and normal heart sounds.  Pulmonary/Chest: Effort normal and breath sounds normal.  Abdominal: Soft. Bowel sounds are normal.  Musculoskeletal: Normal range of motion. He exhibits no edema, tenderness or deformity.  Lymphadenopathy:    He has no cervical adenopathy.  Neurological: He is alert and oriented to person, place, and time.  Skin: Skin is warm and dry. No rash noted. No erythema.  Psychiatric: He has a normal mood and affect. His behavior is normal. Judgment and thought content normal.  Vitals reviewed.    Lab Results  Component Value Date   WBC 7.5 09/24/2017   HGB 15.6 09/24/2017   HCT 43.2 09/24/2017   MCV 84.2 09/24/2017   PLT 215 09/24/2017     Chemistry      Component Value Date/Time  NA 139 09/24/2017 1526   NA 136 09/18/2016 1505   NA 141 03/20/2015 1355   K 3.8 09/24/2017 1526   K 3.9 09/18/2016 1505   K 4.2 03/20/2015 1355   CL 105 09/24/2017 1526   CL 104 09/18/2016 1505   CL 102 12/23/2012 1530   CO2 27 09/24/2017 1526   CO2 24 09/18/2016 1505   CO2 27 03/20/2015 1355   BUN 13 09/24/2017 1526   BUN 19 09/18/2016 1505   BUN 17.6 03/20/2015 1355   CREATININE 1.20 09/24/2017 1526   CREATININE 1.38 (H) 09/18/2016 1505   CREATININE 1.5 (H) 03/20/2015 1355      Component Value Date/Time   CALCIUM 9.1 09/24/2017 1526   CALCIUM  9.3 09/18/2016 1505   CALCIUM 9.7 03/20/2015 1355   ALKPHOS 81 09/24/2017 1526   ALKPHOS 87 09/18/2016 1505   ALKPHOS 76 03/20/2015 1355   AST 40 (H) 09/24/2017 1526   AST 49 (H) 03/20/2015 1355   ALT 56 (H) 09/24/2017 1526   ALT 81 (H) 03/20/2015 1355   BILITOT 0.8 09/24/2017 1526   BILITOT 1.04 03/20/2015 1355         Impression and Plan: Mr. Marchiano is a 57 year old white male.Marland Kitchen He has male breast cancer. He had a mastectomy.This was in July 2011. We did give him adjuvant chemotherapy with Adriamycin/Cytoxan. He got 6 cycles that were completed in October of 2011.  Everything looks okay from my point of view. I don't see any recurrence of his breast cancer.   We have tested him for the BCRA genetic mutation. He is negative for this.  I think that we can now get him back yearly. I just think that the risk of recurrence for him is going to be less than 10%.  I do want to make sure that we check his PSA when we see him back.  It sounds like he is going try to retire this year.   Volanda Napoleon, MD 6/7/20194:42 PM

## 2017-09-25 LAB — VITAMIN D 25 HYDROXY (VIT D DEFICIENCY, FRACTURES): VIT D 25 HYDROXY: 33.2 ng/mL (ref 30.0–100.0)

## 2017-09-26 LAB — PSA (REFLEX TO FREE) (SERIAL): Prostate Specific Ag, Serum: 2.3 ng/mL (ref 0.0–4.0)

## 2017-09-27 ENCOUNTER — Encounter: Payer: Self-pay | Admitting: *Deleted

## 2018-01-27 DIAGNOSIS — Z853 Personal history of malignant neoplasm of breast: Secondary | ICD-10-CM | POA: Diagnosis not present

## 2018-02-22 DIAGNOSIS — H9191 Unspecified hearing loss, right ear: Secondary | ICD-10-CM | POA: Diagnosis not present

## 2018-02-22 DIAGNOSIS — H938X1 Other specified disorders of right ear: Secondary | ICD-10-CM | POA: Diagnosis not present

## 2018-02-22 DIAGNOSIS — H6121 Impacted cerumen, right ear: Secondary | ICD-10-CM | POA: Diagnosis not present

## 2018-02-22 DIAGNOSIS — N6325 Unspecified lump in the left breast, overlapping quadrants: Secondary | ICD-10-CM | POA: Diagnosis not present

## 2018-02-22 DIAGNOSIS — D179 Benign lipomatous neoplasm, unspecified: Secondary | ICD-10-CM | POA: Diagnosis not present

## 2018-02-22 DIAGNOSIS — Z853 Personal history of malignant neoplasm of breast: Secondary | ICD-10-CM | POA: Diagnosis not present

## 2018-03-07 ENCOUNTER — Other Ambulatory Visit: Payer: Self-pay | Admitting: Radiology

## 2018-03-07 DIAGNOSIS — N6325 Unspecified lump in the left breast, overlapping quadrants: Secondary | ICD-10-CM | POA: Diagnosis not present

## 2018-03-09 DIAGNOSIS — I129 Hypertensive chronic kidney disease with stage 1 through stage 4 chronic kidney disease, or unspecified chronic kidney disease: Secondary | ICD-10-CM | POA: Diagnosis not present

## 2018-03-09 DIAGNOSIS — N183 Chronic kidney disease, stage 3 (moderate): Secondary | ICD-10-CM | POA: Diagnosis not present

## 2018-03-25 DIAGNOSIS — N6089 Other benign mammary dysplasias of unspecified breast: Secondary | ICD-10-CM | POA: Diagnosis not present

## 2018-03-25 DIAGNOSIS — N650 Deformity of reconstructed breast: Secondary | ICD-10-CM | POA: Diagnosis not present

## 2018-03-25 DIAGNOSIS — N651 Disproportion of reconstructed breast: Secondary | ICD-10-CM | POA: Diagnosis not present

## 2018-03-25 DIAGNOSIS — Z853 Personal history of malignant neoplasm of breast: Secondary | ICD-10-CM | POA: Diagnosis not present

## 2018-03-25 DIAGNOSIS — N62 Hypertrophy of breast: Secondary | ICD-10-CM | POA: Diagnosis not present

## 2018-05-10 ENCOUNTER — Encounter: Payer: Self-pay | Admitting: Hematology & Oncology

## 2018-06-02 DIAGNOSIS — R509 Fever, unspecified: Secondary | ICD-10-CM | POA: Diagnosis not present

## 2018-06-02 DIAGNOSIS — R69 Illness, unspecified: Secondary | ICD-10-CM | POA: Diagnosis not present

## 2019-07-20 ENCOUNTER — Ambulatory Visit: Payer: 59 | Attending: Internal Medicine

## 2019-07-20 DIAGNOSIS — Z23 Encounter for immunization: Secondary | ICD-10-CM

## 2019-07-20 NOTE — Progress Notes (Signed)
   Covid-19 Vaccination Clinic  Name:  Ralph Buck    MRN: [id number] DOB: 1961/01/07  07/20/2019  [name] was observed post Covid-19 immunization for 15 minutes without incident. He was provided with Vaccine Information Sheet and instruction to access the V-Safe system.   [name] was instructed to call 911 with any severe reactions post vaccine: Marland Kitchen Difficulty breathing  . Swelling of face and throat  . A fast heartbeat  . A bad rash all over body  . Dizziness and weakness   Immunizations Administered    Name Date Dose VIS Date Route   Pfizer COVID-19 Vaccine 07/20/2019 11:01 AM 0.3 mL 03/31/2019 Intramuscular   Manufacturer: [employer], [employer]   Lot: [id number]   [place]: [id number]

## 2019-08-14 ENCOUNTER — Ambulatory Visit: Payer: 59

## 2019-08-16 ENCOUNTER — Ambulatory Visit: Payer: 59

## 2019-08-21 ENCOUNTER — Ambulatory Visit: Payer: 59 | Attending: Internal Medicine

## 2019-08-21 DIAGNOSIS — Z23 Encounter for immunization: Secondary | ICD-10-CM

## 2019-08-21 NOTE — Progress Notes (Signed)
   Covid-19 Vaccination Clinic  Name:  HOLLAND MIHALIK    MRN: [id number] DOB: 02-15-1961  08/21/2019  Mr. Meller was observed post Covid-19 immunization for 15 minutes without incident. He was provided with Vaccine Information Sheet and instruction to access the V-Safe system.   Mr. Arnette was instructed to call 911 with any severe reactions post vaccine: Marland Kitchen Difficulty breathing  . Swelling of face and throat  . A fast heartbeat  . A bad rash all over body  . Dizziness and weakness   Immunizations Administered    Name Date Dose VIS Date Route   Pfizer COVID-19 Vaccine 08/21/2019  3:18 PM 0.3 mL 06/14/2018 Intramuscular   Manufacturer: [place]   Lot: [id number]   [place]: [id number]

## 2019-11-30 ENCOUNTER — Encounter: Payer: Self-pay | Admitting: Genetic Counselor

## 2020-01-16 ENCOUNTER — Telehealth: Payer: Self-pay | Admitting: Genetic Counselor

## 2020-01-16 ENCOUNTER — Encounter: Payer: Self-pay | Admitting: Genetic Counselor

## 2020-01-16 NOTE — Telephone Encounter (Signed)
LM on VM that I was calling back to schedule an updated visit for testing.  Left CB information

## 2020-01-19 NOTE — Telephone Encounter (Signed)
Scheduled for 02/08/20 

## 2020-02-08 ENCOUNTER — Other Ambulatory Visit: Payer: Self-pay

## 2020-02-08 ENCOUNTER — Inpatient Hospital Stay: Payer: 59

## 2020-02-08 ENCOUNTER — Inpatient Hospital Stay: Payer: 59 | Attending: Genetic Counselor | Admitting: Genetic Counselor

## 2020-02-08 DIAGNOSIS — Z800 Family history of malignant neoplasm of digestive organs: Secondary | ICD-10-CM

## 2020-02-08 DIAGNOSIS — C50521 Malignant neoplasm of lower-outer quadrant of right male breast: Secondary | ICD-10-CM

## 2020-02-08 DIAGNOSIS — Z170 Estrogen receptor positive status [ER+]: Secondary | ICD-10-CM

## 2020-02-08 DIAGNOSIS — Z8042 Family history of malignant neoplasm of prostate: Secondary | ICD-10-CM | POA: Diagnosis not present

## 2020-02-08 DIAGNOSIS — Z806 Family history of leukemia: Secondary | ICD-10-CM | POA: Diagnosis not present

## 2020-02-09 ENCOUNTER — Encounter: Payer: Self-pay | Admitting: Genetic Counselor

## 2020-02-09 DIAGNOSIS — Z806 Family history of leukemia: Secondary | ICD-10-CM | POA: Insufficient documentation

## 2020-02-09 DIAGNOSIS — Z800 Family history of malignant neoplasm of digestive organs: Secondary | ICD-10-CM | POA: Insufficient documentation

## 2020-02-09 DIAGNOSIS — Z8042 Family history of malignant neoplasm of prostate: Secondary | ICD-10-CM | POA: Insufficient documentation

## 2020-02-09 NOTE — Progress Notes (Signed)
REFERRING PROVIDER: Self-referred due to recall letter   PRIMARY PROVIDER:  Maury Dus, MD  PRIMARY REASON FOR VISIT:  1.  Malignant neoplasm of lower-outer quadrant of right breast of male, ER positive ([place])  1. Family history of leukemia   2. Family history of prostate cancer   3. Family history of throat cancer    HISTORY OF PRESENT ILLNESS:   Ralph Buck, a 59 y.o. male, was seen for a [place] cancer genetics consultation due to a personal history of male breast cancer.  Ralph Buck presents to clinic today to discuss the possibility of a hereditary predisposition to cancer, to discuss updated genetic testing, and to further clarify his future cancer risks, as well as potential cancer risks for family members.   At the age of 74, Ralph Buck was diagnosed with invasive ductal carcinoma of the right breast. The treatment plan included mastectomy with adjuvant chemotherapy.  Ralph Buck had negative BRCA1/2 analysis in 2011 through [facility], which did not include rearrangement testing.   RISK FACTORS:  Clinical breast/chest wall exams: annually per patient  Colonoscopy: yes; most recent age 30 per patient. Number of breast biopsies: 3. See history noted above. Benign biopsies in 2016 and 2019.  Prostate Cancer Screening: normal PSA in 2019 Other Cancer Screening: dermatology as needed per patient   Past Medical History:  Diagnosis Date  . Cancer ([place])    breast  . Chronic kidney disease   . Family history of leukemia   . Family history of prostate cancer   . Family history of throat cancer   . Hypertension   . Malignant neoplasm of other and unspecified sites of male breast 04/12/2012  . Osteoporosis, idiopathic 04/12/2012    Past Surgical History:  Procedure Laterality Date  . BREAST SURGERY     Mastectomy  . LYMPH NODE BIOPSY  1974  . PORT-A-CATH REMOVAL  2011  . power port  2011  . TONSILLECTOMY  1980    Social History   Socioeconomic History  . Marital status:  Married    Spouse name: Not on file  . Number of children: Not on file  . Years of education: Not on file  . Highest education level: Not on file  Occupational History  . Not on file  Tobacco Use  . Smoking status: Never Smoker  . Smokeless tobacco: Never Used  . Tobacco comment: never used tobacco  Substance and Sexual Activity  . Alcohol use: No    Alcohol/week: 0.0 standard drinks  . Drug use: No  . Sexual activity: Not on file  Other Topics Concern  . Not on file  Social History Narrative  . Not on file   Social Determinants of Health   Financial Resource Strain:   . Difficulty of Paying Living Expenses: Not on file  Food Insecurity:   . Worried About Charity fundraiser in the Last Year: Not on file  . Ran Out of Food in the Last Year: Not on file  Transportation Needs:   . Lack of Transportation (Medical): Not on file  . Lack of Transportation (Non-Medical): Not on file  Physical Activity:   . Days of Exercise per Week: Not on file  . Minutes of Exercise per Session: Not on file  Stress:   . Feeling of Stress : Not on file  Social Connections:   . Frequency of Communication with Friends and Family: Not on file  . Frequency of Social Gatherings with Friends and Family: Not on  file  . Attends Religious Services: Not on file  . Active Member of Clubs or Organizations: Not on file  . Attends Archivist Meetings: Not on file  . Marital Status: Not on file     FAMILY HISTORY:  We obtained a detailed, 4-generation family history.  Significant diagnoses are listed below: Family History  Problem Relation Age of Onset  . Leukemia Maternal Uncle        dx 60s  . Prostate cancer Other        several of MGM's brothers dx 34s  . Throat cancer Sister        dx 72s     Ralph Buck has two daughters, ages 107 and 67, both without a history of cancer.  Ralph Buck sister had a history of throat cancer diagnosed in her 28s and passed away at 109.  She had a smoking  history.  No other cancer was reported in Ralph Buck's other sister, age 55, or maternal half sister.  Ralph Buck mother is 31 years old.  Ralph Buck maternal uncle was diagnosed with leukemia in his 77s and passed away at 34.  Several of Ralph Buck's maternal grandmother's brothers had a history of prostate cancer in their 50s.  No other maternal family history of cancer was reported.  Ralph Buck father passed away in his late 98s and did not have cancer.  No other paternal family history of cancer was reported.   Ralph Buck is unaware of previous family history of genetic testing for hereditary cancer risks. Patient's maternal ancestors are of [place] descent, and paternal ancestors are of European/Native American descent. There is reported Jewish ancestry on his maternal side of the family. There is no known consanguinity.  GENETIC COUNSELING ASSESSMENT: Ralph Buck is a 59 y.o. male with a personal history of male breast cancer which is somewhat suggestive of a hereditary cancer syndrome and predisposition to cancer. We, therefore, discussed and recommended the following at today's visit.   DISCUSSION: We discussed that 5 - 10% of cancer is hereditary, with most cases of hereditary male breast cancer associated with mutations in BRCA1/2.  There are other genes that can be associated with hereditary male breast cancer syndromes.  These include but are not limited to [place].  Given that Ralph Buck had genetic testing for hereditary cancer previously, we discussed the differences between the testing that was performed in 2011 and genetic testing offered today.  We discussed that in additions to the genes Ralph Buck had tested previously, additional genes have been found to be associated with male breast cancer. Furthermore, we discussed that more comprehensive testing is available with the availability of rearrangement and RNA testing. We discussed that testing is beneficial for several reasons including  knowing how to follow for their cancer risks and understanding if other family members could be at risk for cancer and allowing them to undergo genetic testing.   We reviewed the characteristics, features and inheritance patterns of hereditary cancer syndromes. We also discussed genetic testing, including the appropriate family members to test, the process of testing, insurance coverage and turn-around-time for results. We discussed the implications of a negative, positive, carrier and/or variant of uncertain significant result. We recommended Ralph Buck pursue genetic testing for a panel that includes genes associated with breast cancer.   Ralph Buck  was offered a common hereditary cancer panel and an expanded pan-cancer panel. Mr. Wattenbarger was informed of the benefits and limitations of each panel, including that  expanded pan-cancer panels contain several preliminary evidence genes that do not have clear management guidelines at this point in time.  We also discussed that as the number of genes included on a panel increases, the chances of variants of uncertain significance increases.  After considering the benefits and limitations of each gene panel, Mr. Life  elected to have an expanded Radio broadcast assistant through [facility] [employer].  The CancerNext-Expanded gene panel offered by [facility] and includes sequencing and rearrangement analysis for the following 77 genes: AIP, ALK, APC*, ATM*, AXIN2, BAP1, BARD1, BLM, BMPR1A, BRCA1*, BRCA2*, BRIP1*, CDC73, CDH1*, CDK4, CDKN1B, CDKN2A, CHEK2*, CTNNA1, DICER1, FANCC, FH, FLCN, GALNT12, KIF1B, LZTR1, MAX, MEN1, MET, MLH1*, MSH2*, MSH3, MSH6*, MUTYH*, NBN, NF1*, NF2, NTHL1, PALB2*, PHOX2B, PMS2*, POT1, PRKAR1A, PTCH1, PTEN*, RAD51C*, RAD51D*, RB1, RECQL, RET, SDHA, SDHAF2, SDHB, SDHC, SDHD, SMAD4, SMARCA4, SMARCB1, SMARCE1, STK11, SUFU, TMEM127, TP53*, TSC1, TSC2, VHL and XRCC2 (sequencing and deletion/duplication); EGFR, EGLN1, HOXB13, KIT, MITF, PDGFRA, POLD1, and  POLE (sequencing only); EPCAM and GREM1 (deletion/duplication only). DNA and RNA analyses performed for * genes.  Based on Mr. Kundrat's personal history of male breast cancer and Jewish ancestry, he meets medical criteria for genetic testing.  Mr. Guardado had normal genetic testing for the BRCA1/2 genes in 2011 (result not available for review today). Because there are other genes known to increase breast cancer risk that he has not had testing for, and because mutations in these genes may impact medical management, it is appropriate to pursue updated genetic testing for other hereditary breast cancer genes. Of note, this testing did not include large genomic rearrangement analysis of the BRCA genes and was thus incomplete. Additionally, there are other genes that are known to increase breast cancer risk and mutations in these genes may impact medical management. Therefore, it is appropriate to pursue updated genetic testing for other hereditary breast cancer genes.  Despite that he meets criteria, he may still have an out of pocket cost. We discussed that if his out of pocket cost for testing is over $100, the laboratory will call and confirm whether he wants to proceed with testing.  If the out of pocket cost of testing is less than $100 he will be billed by the genetic testing laboratory.   PLAN: After considering the risks, benefits, and limitations, Mr. Obryant provided informed consent to pursue genetic testing and the blood sample was sent to [facility] for analysis of the CancerNext-Expanded Panel. Results should be available within approximately 3 weeks' time, at which point they will be disclosed by telephone to Mr. Hemler, as will any additional recommendations warranted by these results. Mr. Voong will receive a summary of his genetic counseling visit and a copy of his results once available. This information will also be available in Epic.    Lastly, we encouraged Mr. Hargett to remain in  contact with cancer genetics annually so that we can continuously update the family history and inform him of any changes in cancer genetics and testing that may be of benefit for this family.   Mr. Cambre questions were answered to his satisfaction today. Our contact information was provided should additional questions or concerns arise. Thank you for the referral and allowing [place] to share in the care of your patient.   Mayrin Schmuck M. Joette Catching, [place], [facility] Certified Film/video [email or web address] (P) [phone]  The patient was seen for a total of 30 minutes in face-to-face genetic counseling.  This patient was discussed with Drs. Magrinat, Lindi Adie and/or Burr Medico who agrees  with the above.   _______________________________________________________________________ For Office Staff:  Number of people involved in session: 1 Was an Intern/ student involved with case: no

## 2020-02-28 ENCOUNTER — Telehealth: Payer: Self-pay | Admitting: Genetic Counselor

## 2020-02-28 NOTE — Telephone Encounter (Signed)
Revealed negative genetic testing.  Discussed that we do not know why he had breast cancer or why there is cancer in the family. It could be sporadic, due to a different gene that we are not testing, or maybe our current technology may not be able to pick something up.  It will be important for him to keep in contact with genetics to keep up with whether additional testing may be needed.

## 2020-03-04 ENCOUNTER — Encounter: Payer: Self-pay | Admitting: Genetic Counselor

## 2020-03-04 ENCOUNTER — Ambulatory Visit: Payer: Self-pay | Admitting: Genetic Counselor

## 2020-03-04 DIAGNOSIS — Z170 Estrogen receptor positive status [ER+]: Secondary | ICD-10-CM

## 2020-03-04 DIAGNOSIS — C50521 Malignant neoplasm of lower-outer quadrant of right male breast: Secondary | ICD-10-CM

## 2020-03-04 DIAGNOSIS — Z800 Family history of malignant neoplasm of digestive organs: Secondary | ICD-10-CM

## 2020-03-04 DIAGNOSIS — Z8042 Family history of malignant neoplasm of prostate: Secondary | ICD-10-CM

## 2020-03-04 DIAGNOSIS — Z806 Family history of leukemia: Secondary | ICD-10-CM

## 2020-03-04 DIAGNOSIS — Z1379 Encounter for other screening for genetic and chromosomal anomalies: Secondary | ICD-10-CM | POA: Insufficient documentation

## 2020-03-04 NOTE — Progress Notes (Signed)
HPI:  Mr. Mapps was previously seen in the [place] clinic due to a personal history of male breast cancer and concerns regarding a hereditary predisposition to cancer. Please refer to our prior cancer genetics clinic note for more information regarding our discussion, assessment and recommendations, at the time. Mr. Borjon recent genetic test results were disclosed to him, as were recommendations warranted by these results. These results and recommendations are discussed in more detail below.  CANCER HISTORY:  At the age of 59, Mr. Rayson was diagnosed with invasive ductal carcinoma of the right breast. The treatment plan included mastectomy with adjuvant chemotherapy.  Mr. Bernier had negative BRCA1/2 analysis in 2011 through [facility], which did not include rearrangement testing.   Oncology History  Breast cancer of lower-outer quadrant of right male breast ([place])  04/12/2012 Initial Diagnosis   Breast cancer of lower-outer quadrant of right male breast ([place])   02/27/2020 Genetic Testing   No pathogenic variants detected in [facility] CancerNext-Expanded +RNAinsight Panel.  The report date is February 27, 2020.   The CancerNext-Expanded gene panel offered by [facility] and includes sequencing and rearrangement analysis for the following 77 genes: AIP, ALK, APC*, ATM*, AXIN2, BAP1, BARD1, BLM, BMPR1A, BRCA1*, BRCA2*, BRIP1*, CDC73, CDH1*, CDK4, CDKN1B, CDKN2A, CHEK2*, CTNNA1, DICER1, FANCC, FH, FLCN, GALNT12, KIF1B, LZTR1, MAX, MEN1, MET, MLH1*, MSH2*, MSH3, MSH6*, MUTYH*, NBN, NF1*, NF2, NTHL1, PALB2*, PHOX2B, PMS2*, POT1, PRKAR1A, PTCH1, PTEN*, RAD51C*, RAD51D*, RB1, RECQL, RET, SDHA, SDHAF2, SDHB, SDHC, SDHD, SMAD4, SMARCA4, SMARCB1, SMARCE1, STK11, SUFU, TMEM127, TP53*, TSC1, TSC2, VHL and XRCC2 (sequencing and deletion/duplication); EGFR, EGLN1, HOXB13, KIT, MITF, PDGFRA, POLD1, and POLE (sequencing only); EPCAM and GREM1 (deletion/duplication only). DNA and RNA analyses performed  for * genes.     FAMILY HISTORY:  We obtained a detailed, 4-generation family history.  Significant diagnoses are listed below: Family History  Problem Relation Age of Onset  . Leukemia Maternal Uncle        dx 50s  . Prostate cancer Other        several of MGM's brothers dx 18s  . Throat cancer Sister        dx 84s    Mr. Collyer has two daughters, ages 51 and 39, both without a history of cancer.  Mr. Juhasz sister had a history of throat cancer diagnosed in her 65s and passed away at 66.  She had a smoking history.  No other cancer was reported in Mr. Bonczek's other sister, age 67, or maternal half sister.  Mr. Yusko mother is 110 years old.  Mr. Ardizzone maternal uncle was diagnosed with leukemia in his 69s and passed away at 52.  Several of Mr. Criscione's maternal grandmother's brothers had a history of prostate cancer in their 55s.  No other maternal family history of cancer was reported.  Mr. Winsor father passed away in his late 17s and did not have cancer.  No other paternal family history of cancer was reported.   Mr. Hoar is unaware of previous family history of genetic testing for hereditary cancer risks. Patient's maternal ancestors are of [place] descent, and paternal ancestors are of European/Native American descent. There is reported Jewish ancestry on his maternal side of the family. There is no known consanguinity.   GENETIC TEST RESULTS: Genetic testing reported out on February 27, 2020. The CancerNext-Expanded +RNAinsight through [employer] l found no pathogenic mutations. The CancerNext-Expanded gene panel offered by [facility] and includes sequencing and rearrangement analysis for the following 77 genes:  AIP, ALK, APC*, ATM*, AXIN2, BAP1, BARD1, BLM, BMPR1A, BRCA1*, BRCA2*, BRIP1*, CDC73, CDH1*, CDK4, CDKN1B, CDKN2A, CHEK2*, CTNNA1, DICER1, FANCC, FH, FLCN, GALNT12, KIF1B, LZTR1, MAX, MEN1, MET, MLH1*, MSH2*, MSH3, MSH6*, MUTYH*, NBN, NF1*, NF2, NTHL1, PALB2*,  PHOX2B, PMS2*, POT1, PRKAR1A, PTCH1, PTEN*, RAD51C*, RAD51D*, RB1, RECQL, RET, SDHA, SDHAF2, SDHB, SDHC, SDHD, SMAD4, SMARCA4, SMARCB1, SMARCE1, STK11, SUFU, TMEM127, TP53*, TSC1, TSC2, VHL and XRCC2 (sequencing and deletion/duplication); EGFR, EGLN1, HOXB13, KIT, MITF, PDGFRA, POLD1, and POLE (sequencing only); EPCAM and GREM1 (deletion/duplication only). DNA and RNA analyses performed for * genes.  The test report has been scanned into EPIC and is located under the Molecular Pathology section of the Results Review tab.  A portion of the result report is included below for reference.     We discussed with Mr. Zelek that because current genetic testing is not perfect, it is possible there may be a gene mutation in one of these genes that current testing cannot detect, but that chance is small.  We also discussed, that there could be another gene that has not yet been discovered, or that we have not yet tested, that is responsible for the cancer diagnoses in the family. It is also possible there is a hereditary cause for the cancer in the family that Mr. Shipley did not inherit and therefore was not identified in his testing.  Therefore, it is important to remain in touch with cancer genetics in the future so that we can continue to offer Mr. Gretzinger the most up to date genetic testing.   ADDITIONAL GENETIC TESTING: We discussed with Mr. Finnan that his genetic testing was fairly extensive.  If there are genes identified to increase cancer risk that can be analyzed in the future, we would be happy to discuss and coordinate this testing at that time.    CANCER SCREENING RECOMMENDATIONS: Mr. Fowles test result is considered negative (normal).  This means that we have not identified a hereditary cause for his personal history of male breast cancer at this time. Most cancers happen by chance and this negative test suggests that his cancer may fall into this category.    While reassuring, this does not  definitively rule out a hereditary predisposition to cancer. It is still possible that there could be genetic mutations that are undetectable by current technology. There could be genetic mutations in genes that have not been tested or identified to increase cancer risk.  Therefore, it is recommended he continue to follow the cancer management and screening guidelines provided by his oncology and primary healthcare provider.   An individual's cancer risk and medical management are not determined by genetic test results alone. Overall cancer risk assessment incorporates additional factors, including personal medical history, family history, and any available genetic information that may result in a personalized plan for cancer prevention and surveillance  RECOMMENDATIONS FOR FAMILY MEMBERS:  Individuals in this family might be at some increased risk of developing cancer, over the general population risk, simply due to the family history of cancer.  We recommended women in this family have a yearly mammogram beginning at age 95, or 79 years younger than the earliest onset of cancer, an annual clinical breast exam, and perform monthly breast self-exams. Women in this family should also have a gynecological exam as recommended by their primary provider. All family members should be referred for colonoscopy starting at age 64.  FOLLOW-UP: Lastly, we discussed with Mr. Dyar that cancer genetics is a rapidly advancing field and it is  possible that new genetic tests will be appropriate for him and/or his family members in the future. We encouraged him to remain in contact with cancer genetics on an annual basis so we can update his personal and family histories and let him know of advances in cancer genetics that may benefit this family.   Our contact number was provided. Mr. Balthazor questions were answered to his satisfaction, and he knows he is welcome to call us at anytime with additional questions or  concerns.     Ilka Lovick M. Joette Catching, [place], [facility] Certified Film/video [email or web address] (P) [phone]

## 2020-04-20 DIAGNOSIS — R079 Chest pain, unspecified: Secondary | ICD-10-CM

## 2020-04-20 HISTORY — DX: Chest pain, unspecified: R07.9

## 2021-01-23 ENCOUNTER — Other Ambulatory Visit (HOSPITAL_COMMUNITY): Payer: Self-pay | Admitting: Family Medicine

## 2021-01-23 DIAGNOSIS — R079 Chest pain, unspecified: Secondary | ICD-10-CM

## 2021-01-24 ENCOUNTER — Telehealth (HOSPITAL_COMMUNITY): Payer: Self-pay | Admitting: *Deleted

## 2021-01-24 NOTE — Telephone Encounter (Signed)
Close encounter 

## 2021-01-29 ENCOUNTER — Other Ambulatory Visit: Payer: Self-pay

## 2021-01-29 ENCOUNTER — Ambulatory Visit (HOSPITAL_COMMUNITY)
Admission: RE | Admit: 2021-01-29 | Discharge: 2021-01-29 | Disposition: A | Discharge status: Home / Self Care | Payer: 59 | Source: Ambulatory Visit | Attending: Cardiology | Admitting: Cardiology

## 2021-01-29 DIAGNOSIS — R079 Chest pain, unspecified: Secondary | ICD-10-CM

## 2021-01-29 LAB — EXERCISE TOLERANCE TEST
Angina Index: 0
Duke Treadmill Score: 7
Estimated workload: 8.8
Exercise duration (min): 7 min
Exercise duration (sec): 10 s
MPHR: 160 {beats}/min
Peak HR: 157 {beats}/min
Percent HR: 98 %
Rest HR: 71 {beats}/min
ST Depression (mm): 0 mm

## 2021-04-17 ENCOUNTER — Ambulatory Visit (INDEPENDENT_AMBULATORY_CARE_PROVIDER_SITE_OTHER): Payer: 59

## 2021-04-17 ENCOUNTER — Encounter: Payer: Self-pay | Admitting: Podiatry

## 2021-04-17 ENCOUNTER — Ambulatory Visit: Payer: 59 | Admitting: Podiatry

## 2021-04-17 ENCOUNTER — Other Ambulatory Visit: Payer: Self-pay

## 2021-04-17 DIAGNOSIS — M79671 Pain in right foot: Secondary | ICD-10-CM | POA: Diagnosis not present

## 2021-04-17 DIAGNOSIS — M722 Plantar fascial fibromatosis: Secondary | ICD-10-CM | POA: Diagnosis not present

## 2021-04-17 MED ORDER — DICLOFENAC SODIUM 75 MG PO TBEC: 75.0000 mg | DELAYED_RELEASE_TABLET | Freq: Two times a day (BID) | ORAL | 2 refills | Status: DC

## 2021-04-17 MED ORDER — TRIAMCINOLONE ACETONIDE 10 MG/ML IJ SUSP
10.0000 mg | Freq: Once | INTRAMUSCULAR | Status: AC
  Administered 2021-04-17: 11:00:00 10 mg

## 2021-04-17 NOTE — Patient Instructions (Signed)

## 2021-04-17 NOTE — Progress Notes (Signed)
Subjective:   Patient ID: Ralph Buck, male   DOB: 60 y.o.   MRN: [id number]   HPI Patient presents stating he has had several month history of pain in the right heel and states its been sore and making it hard to walk.  States that this has been gradually becoming more of an issue for him recently.  Patient does not smoke likes to be active   Review of Systems  All other systems reviewed and are negative.      Objective:  Physical Exam Vitals and nursing note reviewed.  Constitutional:      Appearance: He is well-developed.  Pulmonary:     Effort: Pulmonary effort is normal.  Musculoskeletal:        General: Normal range of motion.  Skin:    General: Skin is warm.  Neurological:     Mental Status: He is alert.    Neurovascular status found to be intact muscle strength was found to be adequate range of motion adequate.  Patient has moderate to severe discomfort medial band right heel at the insertional point tendon calcaneus inflammation fluid noted around this area with moderate depression of the arch.  Good digital perfusion well oriented x3 acute     Assessment:  Plantar fasciitis right with inflammation fluid medial band     Plan:  H&P reviewed condition and x-rays and today I did sterile prep and injected the fascia at insertion 3 mg Kenalog 5 mg Xylocaine and applied fascial brace to lift up the arch and gave instructions for support.  Patient will be seen back may require orthotics or other treatment and is advised on trying to limit weightbearing as best as possible over the next few days  X-rays indicate no significant spur formation no indication stress fracture or advanced arthritis

## 2021-04-18 ENCOUNTER — Other Ambulatory Visit: Payer: Self-pay | Admitting: Podiatry

## 2021-04-18 DIAGNOSIS — M722 Plantar fascial fibromatosis: Secondary | ICD-10-CM

## 2022-06-01 ENCOUNTER — Other Ambulatory Visit: Payer: Self-pay | Admitting: Urology

## 2022-06-01 DIAGNOSIS — R972 Elevated prostate specific antigen [PSA]: Secondary | ICD-10-CM

## 2022-07-06 ENCOUNTER — Ambulatory Visit
Admission: RE | Admit: 2022-07-06 | Discharge: 2022-07-06 | Disposition: A | Discharge status: Home / Self Care | Payer: 59 | Source: Ambulatory Visit | Attending: Urology | Admitting: Urology

## 2022-07-06 DIAGNOSIS — R972 Elevated prostate specific antigen [PSA]: Secondary | ICD-10-CM

## 2022-07-06 MED ORDER — GADOPICLENOL 0.5 MMOL/ML IV SOLN
10.0000 mL | Freq: Once | INTRAVENOUS | Status: AC | PRN
  Administered 2022-07-06: 10 mL via INTRAVENOUS

## 2022-08-05 ENCOUNTER — Telehealth: Payer: Self-pay

## 2022-08-05 NOTE — Telephone Encounter (Signed)
    1. I confirmed with the patient he is aware of his referral to the clinic 5/10, arriving @ 8/15 am    2. I discussed the format of the clinic and the physicians he will be seeing that day.   3. I discussed where the clinic is located and how to contact me.   4. I confirmed his address and informed him I would be mailing a packet of information and forms to be completed. I asked him to bring them with him the day of his appointment.    He voiced understanding of the above. I asked him to call me if he has any questions or concerns regarding his appointments or the forms he needs to complete.

## 2022-08-27 NOTE — Progress Notes (Signed)
RN spoke with patient and confirmed packet was received.   Pt aware of appointment time and date.

## 2022-08-27 NOTE — Progress Notes (Signed)
                               Care Plan Summary  Name: Ralph Buck DOB: 03-18-1961   Your Medical Team:   Urologist -  Dr. Heloise Purpura, [facility]  Radiation Oncologist - Dr. Margaretmary Dys, [facility]     Recommendations: 1) Radiation   2) Surgery    * These recommendations are based on information available as of today's consult.      Recommendations may change depending on the results of further tests or exams.    Next Steps: 1) Consider all your options.  Contact, Ralph Buck, your nurse navigator with any questions or treatment decisions.      When appointments need to be scheduled, you will be contacted by [facility] and/or [facility].  Questions?  Please do not hesitate to call Ralph Buck, BSN, RN at [phone] with any questions or concerns.  Ralph Buck is your Oncology Nurse Navigator and is available to assist you while you're receiving your medical care at [facility].

## 2022-08-28 ENCOUNTER — Ambulatory Visit
Admission: RE | Admit: 2022-08-28 | Discharge: 2022-08-28 | Disposition: A | Discharge status: Home / Self Care | Payer: 59 | Source: Ambulatory Visit | Attending: Radiation Oncology | Admitting: Radiation Oncology

## 2022-08-28 ENCOUNTER — Encounter: Payer: Self-pay | Admitting: Radiation Oncology

## 2022-08-28 ENCOUNTER — Other Ambulatory Visit: Payer: Self-pay

## 2022-08-28 VITALS — BP 137/87 | HR 68 | Temp 98.0°F | Resp 20 | Wt 275.5 lb

## 2022-08-28 DIAGNOSIS — C61 Malignant neoplasm of prostate: Secondary | ICD-10-CM

## 2022-08-28 NOTE — Progress Notes (Signed)
Radiation Oncology         [phone] ________________________________  Multidisciplinary Prostate Cancer Clinic  Initial Radiation Oncology Consultation  Name: Ralph Buck MRN: [id number]  Date: 08/28/2022  DOB: 11-16-1960  WU:[id number], Ralph Maduro, MD (Inactive)  Ralph Pippin, MD   REFERRING PHYSICIAN: Bjorn Pippin, MD  DIAGNOSIS: 62 y.o. gentleman with Stage T1c adenocarcinoma of the prostate with Gleason score of 3+4, and PSA of 4.29.    ICD-10-CM   1. Malignant neoplasm of prostate (HCC)  C61       HISTORY OF PRESENT ILLNESS: Ralph Buck is a 62 y.o. male with a diagnosis of prostate cancer. He also has a history of breast cancer, s/p mastectomy in 10/2009 and 6 cycles of adjuvant chemotherapy with adriamycin/cytoxan through 01/2010. He was followed by Dr. Myna Buck through 2019. He underwent BRCA genetic testing at diagnosis and additional genetic panels in 01/2020, and all results were negative.  He was initially referred to Dr. Annabell Buck back in 03/2021 for an elevated PSA of 4.52. A repeat PSA from that day showed a drop to 3.58, and he was put on active surveillance. His PSA remained stable in the 3-range through 09/2021. This rose to 4.29 in 03/2022, prompting prostate MRI. Performed on 07/06/22, prostate MRI showed: PI-RADS 4 lesion of right anterior peripheral zone at apex; PI-RADS 3 lesion of left anterior transition zone and left anterior fibromuscular stroma.      The patient proceeded to MRI fusion biopsy of the prostate on 07/24/22.  The prostate volume measured 32 cc. Out of 18 core biopsies, 11 were positive.  The maximum Gleason score was 3+4, and this was seen in all three samples from ROI #1, one sample from ROI #2, and right apex lateral. Per discussion with pathologist Dr. Maurice Buck during our multidisciplinary prostate cancer conference, there is limited Gleason pattern 4 in these samples. Additionally, Gleason 3+3 was seen in right apex, left apex, left apex lateral (small  focus), left mid lateral (small focus), and the other two samples from ROI #2.      The patient reviewed the biopsy results with his urologist and he has kindly been referred today to the multidisciplinary prostate cancer clinic for presentation of pathology and radiology studies in our conference for discussion of potential radiation treatment options and clinical evaluation.   PREVIOUS RADIATION THERAPY: No  PAST MEDICAL HISTORY:  Past Medical History:  Diagnosis Date   Cancer (HCC)    breast   Chronic kidney disease    Family history of leukemia    Family history of prostate cancer    Family history of throat cancer    Hypertension    Malignant neoplasm of other and unspecified sites of male breast 04/12/2012   Osteoporosis, idiopathic 04/12/2012      PAST SURGICAL HISTORY: Past Surgical History:  Procedure Laterality Date   BREAST SURGERY     Mastectomy   LYMPH NODE BIOPSY  1974   PORT-A-CATH REMOVAL  2011   power port  2011   TONSILLECTOMY  1980    FAMILY HISTORY:  Family History  Problem Relation Age of Onset   Diabetes Sister    Diabetes Daughter    Leukemia Maternal Uncle        dx 30s   Prostate cancer Other        several of MGM's brothers dx 40s   Throat cancer Sister        dx 43s    SOCIAL HISTORY:  Social History  Socioeconomic History   Marital status: Married    Spouse name: Not on file   Number of children: Not on file   Years of education: Not on file   Highest education level: Not on file  Occupational History   Not on file  Tobacco Use   Smoking status: Never   Smokeless tobacco: Never   Tobacco comments:    never used tobacco  Substance and Sexual Activity   Alcohol use: No    Alcohol/week: 0.0 standard drinks of alcohol   Drug use: No   Sexual activity: Not on file  Other Topics Concern   Not on file  Social History Narrative   Not on file   Social Determinants of Health   Financial Resource Strain: Not on file  Food  Insecurity: Not on file  Transportation Needs: Not on file  Physical Activity: Not on file  Stress: Not on file  Social Connections: Not on file  Intimate Partner Violence: Not on file    ALLERGIES: Amoxicillin and Penicillins  MEDICATIONS:  Current Outpatient Medications  Medication Sig Dispense Refill   aspirin 81 MG tablet Take 81 mg by mouth daily. Takes 2 tab at night     cholecalciferol (VITAMIN D) 1000 UNITS tablet Take 2,000 Units by mouth daily.       diclofenac (VOLTAREN) 75 MG EC tablet Take 1 tablet (75 mg total) by mouth 2 (two) times daily. 50 tablet 2   No current facility-administered medications for this encounter.      REVIEW OF SYSTEMS:  On review of systems, the patient reports that he is doing well overall. He denies any chest pain, shortness of breath, cough, fevers, chills, night sweats, unintended weight changes. He denies any bowel disturbances, and denies abdominal pain, nausea or vomiting. He denies any new musculoskeletal or joint aches or pains. His IPSS and IIEF were recorded.     PHYSICAL EXAM:  Wt Readings from Last 3 Encounters:  08/28/22 275 lb 8 oz (125 kg)  09/24/17 288 lb (130.6 kg)  09/18/16 277 lb 8 oz (125.9 kg)   Temp Readings from Last 3 Encounters:  08/28/22 98 F (36.7 C) (Temporal)  09/24/17 98.8 F (37.1 C) (Oral)  09/18/16 98.3 F (36.8 C) (Oral)   BP Readings from Last 3 Encounters:  08/28/22 137/87  09/24/17 (!) 144/87  09/18/16 118/86   Pulse Readings from Last 3 Encounters:  08/28/22 68  09/24/17 80  09/18/16 67    /10  In general this is a well appearing male in no acute distress. He's alert and oriented x4 and appropriate throughout the examination. Cardiopulmonary assessment is negative for acute distress, and he exhibits normal effort.     KPS = 100  100 - Normal; no complaints; no evidence of disease. 90   - Able to carry on normal activity; minor signs or symptoms of disease. 80   - Normal activity  with effort; some signs or symptoms of disease. 66   - Cares for self; unable to carry on normal activity or to do active work. 60   - Requires occasional assistance, but is able to care for most of his personal needs. 50   - Requires considerable assistance and frequent medical care. 40   - Disabled; requires special care and assistance. 30   - Severely disabled; hospital admission is indicated although death not imminent. 20   - Very sick; hospital admission necessary; active supportive treatment necessary. 10   - Moribund; fatal processes  progressing rapidly. 0     - Dead  Karnofsky DA, Abelmann WH, Craver LS and [facility] [phone]) The use of the nitrogen mustards in the palliative treatment of carcinoma: with particular reference to bronchogenic carcinoma Cancer 1 [phone]  LABORATORY DATA:  Lab Results  Component Value Date   WBC 7.5 09/24/2017   HGB 15.6 09/24/2017   HCT 43.2 09/24/2017   MCV 84.2 09/24/2017   PLT 215 09/24/2017   Lab Results  Component Value Date   NA 139 09/24/2017   K 3.8 09/24/2017   CL 105 09/24/2017   CO2 27 09/24/2017   Lab Results  Component Value Date   ALT 56 (H) 09/24/2017   AST 40 (H) 09/24/2017   ALKPHOS 81 09/24/2017   BILITOT 0.8 09/24/2017     RADIOGRAPHY: No results found.    IMPRESSION/PLAN: 1. 62 y.o. gentleman with Stage T1c adenocarcinoma of the prostate with Gleason Score of 3+4, and PSA of 4.29. We discussed the patient's workup and outlined the nature of prostate cancer in this setting. The patient's T stage, Gleason's score, and PSA put him into the favorable intermediate risk group. Accordingly, he is eligible for a variety of potential treatment options including brachytherapy, 5.5 weeks of external radiation, or prostatectomy. We discussed the available radiation techniques, and focused on the details and logistics of delivery. We discussed and outlined the risks, benefits, short and long-term effects associated with  radiotherapy and compared and contrasted these with prostatectomy. We discussed the role of SpaceOAR gel in reducing the rectal toxicity associated with radiotherapy. He appears to have a good understanding of his disease and our treatment recommendations which are of curative intent.  He was encouraged to ask questions that were answered to his stated satisfaction.  At the conclusion of our conversation, the patient is interested in moving forward with considering his options.  Initially, he was leaning toward surgery, and left with some more information about IMRT and seed implant.  We personally spent 60 minutes in this encounter including chart review, reviewing radiological studies, meeting face-to-face with the patient, entering orders and completing documentation.   ------------------------------------------------   Margaretmary Dys, MD [facility]  Radiation Oncology Direct Dial: [phone]  Fax: [phone] [email or web address]  Skype  [email or web address]   This document serves as a record of services personally performed by Margaretmary Dys, MD. It was created on his behalf by Mickie Bail, a trained medical scribe. The creation of this record is based on the scribe's personal observations and the provider's statements to them. This document has been checked and approved by the attending provider.
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
## 2022-09-03 ENCOUNTER — Other Ambulatory Visit: Payer: Self-pay | Admitting: Urology

## 2022-09-04 NOTE — Progress Notes (Signed)
Patient has decided to proceed with prostatectomy for his stage T1c adenocarcinoma of the prostate with Gleason score of 3+4, and PSA of 4.29 and is scheduled with Dr. Laverle Patter on 11/23/2022.   RN spoke with patient and answered questions related to side effects of surgery.  No additional needs at this time.   Plan of care in progress.

## 2022-10-29 NOTE — Patient Instructions (Signed)
DUE TO COVID-19 ONLY TWO VISITORS  (aged 62 and older)  ARE ALLOWED TO COME WITH YOU AND STAY IN THE WAITING ROOM ONLY DURING PRE OP AND PROCEDURE.   **NO VISITORS ARE ALLOWED IN THE SHORT STAY AREA OR RECOVERY ROOM!!**  IF YOU WILL BE ADMITTED INTO THE HOSPITAL YOU ARE ALLOWED ONLY FOUR SUPPORT PEOPLE DURING VISITATION HOURS ONLY (7 AM -8PM)   The support person(s) must pass our screening, gel in and out, and wear a mask at all times, including in the patient's room. Patients must also wear a mask when staff or their support person are in the room. Visitors GUEST BADGE MUST BE WORN VISIBLY  One adult visitor may remain with you overnight and MUST be in the room by 8 P.M.     Your procedure is scheduled on: 11/23/22   Report to [facility] Main Entrance    Report to admitting at : 9:00 AM   Call this number if you have problems the morning of surgery [phone]   Clear liquids starting the day before surgery until : 8:00 AM DAY OF SURGERY  Water Black Coffee (sugar ok, NO MILK/CREAM OR CREAMERS)  Tea (sugar ok, NO MILK/CREAM OR CREAMERS) regular and decaf                             Plain Jell-O (NO RED)                                           Fruit ices (not with fruit pulp, NO RED)                                     Popsicles (NO RED)                                                                  Juice: apple, WHITE grape, WHITE cranberry Sports drinks like Gatorade (NO RED)             FOLLOW BOWEL PREP AND ANY ADDITIONAL PRE OP INSTRUCTIONS YOU RECEIVED FROM YOUR SURGEON'S OFFICE!!!   Oral Hygiene is also important to reduce your risk of infection.                                    Remember - BRUSH YOUR TEETH THE MORNING OF SURGERY WITH YOUR REGULAR TOOTHPASTE  DENTURES WILL BE REMOVED PRIOR TO SURGERY PLEASE DO NOT APPLY "Poly grip" OR ADHESIVES!!!   Do NOT smoke after Midnight   Take these medicines the morning of surgery with A SIP OF WATER: N/A                          You may not have any metal on your body including hair pins, jewelry, and body piercing             Do not wear lotions, powders, perfumes/cologne, or deodorant  Men may shave face and neck.   Do not bring valuables to the hospital. [facility] IS NOT             RESPONSIBLE   FOR VALUABLES.   Contacts, glasses, or bridgework may not be worn into surgery.   Bring small overnight bag day of surgery.   DO NOT BRING YOUR HOME MEDICATIONS TO THE HOSPITAL. PHARMACY WILL DISPENSE MEDICATIONS LISTED ON YOUR MEDICATION LIST TO YOU DURING YOUR ADMISSION IN THE HOSPITAL!    Patients discharged on the day of surgery will not be allowed to drive home.  Someone NEEDS to stay with you for the first 24 hours after anesthesia.   Special Instructions: Bring a copy of your healthcare power of attorney and living will documents         the day of surgery if you haven't scanned them before.              Please read over the following fact sheets you were given: IF YOU HAVE QUESTIONS ABOUT YOUR PRE-OP INSTRUCTIONS PLEASE CALL [phone]    [facility] - Preparing for Surgery Before surgery, you can play an important role.  Because skin is not sterile, your skin needs to be as free of germs as possible.  You can reduce the number of germs on your skin by washing with CHG (chlorahexidine gluconate) soap before surgery.  CHG is an antiseptic cleaner which kills germs and bonds with the skin to continue killing germs even after washing. Please DO NOT use if you have an allergy to CHG or antibacterial soaps.  If your skin becomes reddened/irritated stop using the CHG and inform your nurse when you arrive at Short Stay. Do not shave (including legs and underarms) for at least 48 hours prior to the first CHG shower.  You may shave your face/neck. Please follow these instructions carefully:  1.  Shower with CHG Soap the night before surgery and the  morning of Surgery.  2.  If you choose  to wash your hair, wash your hair first as usual with your  normal  shampoo.  3.  After you shampoo, rinse your hair and body thoroughly to remove the  shampoo.                           4.  Use CHG as you would any other liquid soap.  You can apply chg directly  to the skin and wash                       Gently with a scrungie or clean washcloth.  5.  Apply the CHG Soap to your body ONLY FROM THE NECK DOWN.   Do not use on face/ open                           Wound or open sores. Avoid contact with eyes, ears mouth and genitals (private parts).                       Wash face,  Genitals (private parts) with your normal soap.             6.  Wash thoroughly, paying special attention to the area where your surgery  will be performed.  7.  Thoroughly rinse your body with warm water from the neck down.  8.  DO  NOT shower/wash with your normal soap after using and rinsing off  the CHG Soap.                9.  Pat yourself dry with a clean towel.            10.  Wear clean pajamas.            11.  Place clean sheets on your bed the night of your first shower and do not  sleep with pets. Day of Surgery : Do not apply any lotions/deodorants the morning of surgery.  Please wear clean clothes to the hospital/surgery center.  FAILURE TO FOLLOW THESE INSTRUCTIONS MAY RESULT IN THE CANCELLATION OF YOUR SURGERY PATIENT SIGNATURE_________________________________  NURSE SIGNATURE__________________________________  ________________________________________________________________________ WHAT IS A BLOOD TRANSFUSION? Blood Transfusion Information  A transfusion is the replacement of blood or some of its parts. Blood is made up of multiple cells which provide different functions. Red blood cells carry oxygen and are used for blood loss replacement. White blood cells fight against infection. Platelets control bleeding. Plasma helps clot blood. Other blood products are available for specialized needs, such as  hemophilia or other clotting disorders. BEFORE THE TRANSFUSION  Who gives blood for transfusions?  Healthy volunteers who are fully evaluated to make sure their blood is safe. This is blood bank blood. Transfusion therapy is the safest it has ever been in the practice of medicine. Before blood is taken from a donor, a complete history is taken to make sure that person has no history of diseases nor engages in risky social behavior (examples are intravenous drug use or sexual activity with multiple partners). The donor's travel history is screened to minimize risk of transmitting infections, such as malaria. The donated blood is tested for signs of infectious diseases, such as HIV and hepatitis. The blood is then tested to be sure it is compatible with you in order to minimize the chance of a transfusion reaction. If you or a relative donates blood, this is often done in anticipation of surgery and is not appropriate for emergency situations. It takes many days to process the donated blood. RISKS AND COMPLICATIONS Although transfusion therapy is very safe and saves many lives, the main dangers of transfusion include:  Getting an infectious disease. Developing a transfusion reaction. This is an allergic reaction to something in the blood you were given. Every precaution is taken to prevent this. The decision to have a blood transfusion has been considered carefully by your caregiver before blood is given. Blood is not given unless the benefits outweigh the risks. AFTER THE TRANSFUSION Right after receiving a blood transfusion, you will usually feel much better and more energetic. This is especially true if your red blood cells have gotten low (anemic). The transfusion raises the level of the red blood cells which carry oxygen, and this usually causes an energy increase. The nurse administering the transfusion will monitor you carefully for complications. HOME CARE INSTRUCTIONS  No special instructions  are needed after a transfusion. You may find your energy is better. Speak with your caregiver about any limitations on activity for underlying diseases you may have. SEEK MEDICAL CARE IF:  Your condition is not improving after your transfusion. You develop redness or irritation at the intravenous (IV) site. SEEK IMMEDIATE MEDICAL CARE IF:  Any of the following symptoms occur over the next 12 hours: Shaking chills. You have a temperature by mouth above 102 F (38.9 C), not controlled by medicine. Chest, back,  or muscle pain. People around you feel you are not acting correctly or are confused. Shortness of breath or difficulty breathing. Dizziness and fainting. You get a rash or develop hives. You have a decrease in urine output. Your urine turns a dark color or changes to pink, red, or brown. Any of the following symptoms occur over the next 10 days: You have a temperature by mouth above 102 F (38.9 C), not controlled by medicine. Shortness of breath. Weakness after normal activity. The white part of the eye turns yellow (jaundice). You have a decrease in the amount of urine or are urinating less often. Your urine turns a dark color or changes to pink, red, or brown. Document Released: 04/03/2000 Document Revised: 06/29/2011 Document Reviewed: 11/21/2007 [facility] Patient Information 2014 [place], [place].  _______________________________________________________________________

## 2022-11-10 ENCOUNTER — Other Ambulatory Visit: Payer: Self-pay

## 2022-11-10 ENCOUNTER — Encounter (HOSPITAL_COMMUNITY): Payer: Self-pay

## 2022-11-10 ENCOUNTER — Encounter (HOSPITAL_COMMUNITY)
Admission: RE | Admit: 2022-11-10 | Discharge: 2022-11-10 | Disposition: A | Discharge status: Home / Self Care | Payer: 59 | Source: Ambulatory Visit | Attending: Anesthesiology | Admitting: Anesthesiology

## 2022-11-10 DIAGNOSIS — Z01812 Encounter for preprocedural laboratory examination: Secondary | ICD-10-CM | POA: Diagnosis not present

## 2022-11-10 DIAGNOSIS — Z01810 Encounter for preprocedural cardiovascular examination: Secondary | ICD-10-CM | POA: Diagnosis not present

## 2022-11-10 DIAGNOSIS — I2510 Atherosclerotic heart disease of native coronary artery without angina pectoris: Secondary | ICD-10-CM

## 2022-11-10 DIAGNOSIS — Z01818 Encounter for other preprocedural examination: Secondary | ICD-10-CM | POA: Diagnosis present

## 2022-11-10 HISTORY — DX: Unspecified osteoarthritis, unspecified site: M19.90

## 2022-11-10 NOTE — Progress Notes (Addendum)
Pt. Did not show up for PST appointment at 2:00 PM today.Several phone calls were attempted,message was left with call back number. Able to do interview over the phone with pt. He is coming for labs tomorrow (11/11/22 @ 8:00 AM).

## 2022-11-10 NOTE — Progress Notes (Signed)
For Short Stay: COVID SWAB appointment date:  Bowel Prep reminder: Reviewed.   For Anesthesia: PCP - Dr. Elias Else Cardiologist - N/A  Chest x-ray -  EKG - 11/11/22 Stress Test - 01/29/21 ECHO -  Cardiac Cath -  Pacemaker/ICD device last checked: Pacemaker orders received: Device Rep notified:  Spinal Cord Stimulator: N/A  Sleep Study - N/A CPAP -   Fasting Blood Sugar - N/A Checks Blood Sugar _____ times a day Date and result of last Hgb A1c-  Last dose of GLP1 agonist- N/A GLP1 instructions:   Last dose of SGLT-2 inhibitors- N/A SGLT-2 instructions:   Blood Thinner Instructions:  N/A Aspirin Instructions: Last Dose:  Activity level: Can go up a flight of stairs and activities of daily living without stopping and without chest pain and/or shortness of breath   Able to exercise without chest pain and/or shortness of breath  Anesthesia review: Hx: HTN,CKD III,Chest pain.  Patient denies shortness of breath, fever, cough and chest pain at PAT appointment   Patient verbalized understanding of instructions that were given to them at the PAT appointment. Patient was also instructed that they will need to review over the PAT instructions again at home before surgery.

## 2022-11-11 ENCOUNTER — Encounter (HOSPITAL_COMMUNITY)
Admission: RE | Admit: 2022-11-11 | Discharge: 2022-11-11 | Disposition: A | Discharge status: Home / Self Care | Payer: 59 | Source: Ambulatory Visit | Attending: Urology | Admitting: Urology

## 2022-11-11 DIAGNOSIS — Z01810 Encounter for preprocedural cardiovascular examination: Secondary | ICD-10-CM | POA: Diagnosis not present

## 2022-11-11 DIAGNOSIS — I2510 Atherosclerotic heart disease of native coronary artery without angina pectoris: Secondary | ICD-10-CM

## 2022-11-11 DIAGNOSIS — Z01812 Encounter for preprocedural laboratory examination: Secondary | ICD-10-CM | POA: Diagnosis not present

## 2022-11-11 LAB — CBC
HCT: 40.8 % (ref 39.0–52.0)
Hemoglobin: 14.4 g/dL (ref 13.0–17.0)
MCH: 30.6 pg (ref 26.0–34.0)
MCHC: 35.3 g/dL (ref 30.0–36.0)
MCV: 86.8 fL (ref 80.0–100.0)
Platelets: 226 10*3/uL (ref 150–400)
RBC: 4.7 MIL/uL (ref 4.22–5.81)
RDW: 12.4 % (ref 11.5–15.5)
WBC: 6.1 10*3/uL (ref 4.0–10.5)
nRBC: 0 % (ref 0.0–0.2)

## 2022-11-11 LAB — BASIC METABOLIC PANEL
Anion gap: 11 (ref 5–15)
BUN: 18 mg/dL (ref 8–23)
CO2: 22 mmol/L (ref 22–32)
Calcium: 9 mg/dL (ref 8.9–10.3)
Chloride: 104 mmol/L (ref 98–111)
Creatinine, Ser: 1.11 mg/dL (ref 0.61–1.24)
GFR, Estimated: >60 mL/min (ref >60)
Glucose, Bld: 134 mg/dL — ABNORMAL HIGH (ref 70–99)
Potassium: 3.7 mmol/L (ref 3.5–5.1)
Sodium: 137 mmol/L (ref 135–145)

## 2022-11-11 LAB — TYPE AND SCREEN
ABO/RH(D): A POS
Antibody Screen: NEGATIVE
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
## 2022-11-23 ENCOUNTER — Encounter (HOSPITAL_COMMUNITY)
Admission: RE | Disposition: A | Discharge status: Home / Self Care | Payer: Self-pay | Source: Home / Self Care | Attending: Urology

## 2022-11-23 ENCOUNTER — Other Ambulatory Visit: Payer: Self-pay

## 2022-11-23 ENCOUNTER — Encounter (HOSPITAL_COMMUNITY): Payer: Self-pay | Admitting: Urology

## 2022-11-23 ENCOUNTER — Observation Stay (HOSPITAL_COMMUNITY)
Admission: RE | Admit: 2022-11-23 | Discharge: 2022-11-24 | Disposition: A | Discharge status: Home / Self Care | Payer: 59 | Attending: Urology | Admitting: Urology

## 2022-11-23 ENCOUNTER — Ambulatory Visit (HOSPITAL_COMMUNITY): Payer: 59 | Admitting: Physician Assistant

## 2022-11-23 ENCOUNTER — Ambulatory Visit (HOSPITAL_BASED_OUTPATIENT_CLINIC_OR_DEPARTMENT_OTHER): Payer: 59 | Admitting: Certified Registered"

## 2022-11-23 DIAGNOSIS — Z853 Personal history of malignant neoplasm of breast: Secondary | ICD-10-CM | POA: Diagnosis not present

## 2022-11-23 DIAGNOSIS — C61 Malignant neoplasm of prostate: Principal | ICD-10-CM | POA: Insufficient documentation

## 2022-11-23 DIAGNOSIS — I10 Essential (primary) hypertension: Secondary | ICD-10-CM | POA: Diagnosis not present

## 2022-11-23 DIAGNOSIS — Z86718 Personal history of other venous thrombosis and embolism: Secondary | ICD-10-CM | POA: Insufficient documentation

## 2022-11-23 DIAGNOSIS — Z79899 Other long term (current) drug therapy: Secondary | ICD-10-CM | POA: Insufficient documentation

## 2022-11-23 DIAGNOSIS — N189 Chronic kidney disease, unspecified: Secondary | ICD-10-CM | POA: Diagnosis not present

## 2022-11-23 DIAGNOSIS — I129 Hypertensive chronic kidney disease with stage 1 through stage 4 chronic kidney disease, or unspecified chronic kidney disease: Secondary | ICD-10-CM

## 2022-11-23 HISTORY — PX: LYMPHADENECTOMY: SHX5960

## 2022-11-23 HISTORY — PX: ROBOT ASSISTED LAPAROSCOPIC RADICAL PROSTATECTOMY: SHX5141

## 2022-11-23 LAB — HEMOGLOBIN AND HEMATOCRIT, BLOOD
HCT: 38.4 % — ABNORMAL LOW (ref 39.0–52.0)
Hemoglobin: 13.5 g/dL (ref 13.0–17.0)

## 2022-11-23 SURGERY — XI ROBOTIC ASSISTED LAPAROSCOPIC RADICAL PROSTATECTOMY LEVEL 3
Anesthesia: General

## 2022-11-23 MED ORDER — HEPARIN SODIUM (PORCINE) 1000 UNIT/ML IJ SOLN
INTRAMUSCULAR | Status: AC
  Filled 2022-11-23: qty 1

## 2022-11-23 MED ORDER — ORAL CARE MOUTH RINSE: 15.0000 mL | Freq: Once | OROMUCOSAL | Status: AC

## 2022-11-23 MED ORDER — DOCUSATE SODIUM 100 MG PO CAPS: 100.0000 mg | ORAL_CAPSULE | Freq: Two times a day (BID) | ORAL | Status: AC

## 2022-11-23 MED ORDER — CEFAZOLIN SODIUM-DEXTROSE 1-4 GM/50ML-% IV SOLN
1.0000 g | Freq: Three times a day (TID) | INTRAVENOUS | Status: AC
  Administered 2022-11-23 – 2022-11-24 (×2): 1 g via INTRAVENOUS
  Filled 2022-11-23 (×2): qty 50

## 2022-11-23 MED ORDER — FLEET ENEMA 7-19 GM/118ML RE ENEM: 1.0000 | ENEMA | Freq: Once | RECTAL | Status: DC

## 2022-11-23 MED ORDER — MIDAZOLAM HCL 2 MG/2ML IJ SOLN
INTRAMUSCULAR | Status: AC
  Filled 2022-11-23: qty 2

## 2022-11-23 MED ORDER — HYOSCYAMINE SULFATE 0.125 MG SL SUBL
0.1250 mg | SUBLINGUAL_TABLET | Freq: Four times a day (QID) | SUBLINGUAL | Status: DC | PRN
  Administered 2022-11-23: 0.125 mg via SUBLINGUAL
  Filled 2022-11-23: qty 1

## 2022-11-23 MED ORDER — SODIUM CHLORIDE 0.9 % IV BOLUS
1000.0000 mL | Freq: Once | INTRAVENOUS | Status: AC
  Administered 2022-11-23: 1000 mL via INTRAVENOUS

## 2022-11-23 MED ORDER — CHLORHEXIDINE GLUCONATE CLOTH 2 % EX PADS
6.0000 | MEDICATED_PAD | Freq: Every day | CUTANEOUS | Status: DC
  Administered 2022-11-24 (×2): 6 via TOPICAL

## 2022-11-23 MED ORDER — MAGNESIUM CITRATE PO SOLN: 1.0000 | Freq: Once | ORAL | Status: DC

## 2022-11-23 MED ORDER — HYDROMORPHONE HCL 1 MG/ML IJ SOLN
INTRAMUSCULAR | Status: AC
  Filled 2022-11-23: qty 1

## 2022-11-23 MED ORDER — PROMETHAZINE HCL 25 MG/ML IJ SOLN: 6.2500 mg | INTRAMUSCULAR | Status: DC | PRN

## 2022-11-23 MED ORDER — AMISULPRIDE (ANTIEMETIC) 5 MG/2ML IV SOLN
INTRAVENOUS | Status: AC
  Filled 2022-11-23: qty 4

## 2022-11-23 MED ORDER — KETOROLAC TROMETHAMINE 15 MG/ML IJ SOLN
15.0000 mg | Freq: Four times a day (QID) | INTRAMUSCULAR | Status: DC
  Administered 2022-11-23 – 2022-11-24 (×3): 15 mg via INTRAVENOUS
  Filled 2022-11-23 (×3): qty 1

## 2022-11-23 MED ORDER — MIDAZOLAM HCL 5 MG/5ML IJ SOLN
INTRAMUSCULAR | Status: DC | PRN
  Administered 2022-11-23: 2 mg via INTRAVENOUS

## 2022-11-23 MED ORDER — DIPHENHYDRAMINE HCL 50 MG/ML IJ SOLN: 12.5000 mg | Freq: Four times a day (QID) | INTRAMUSCULAR | Status: DC | PRN

## 2022-11-23 MED ORDER — LACTATED RINGERS IV SOLN: INTRAVENOUS | Status: DC

## 2022-11-23 MED ORDER — PROPOFOL 10 MG/ML IV BOLUS
INTRAVENOUS | Status: DC | PRN
Start: 2022-11-23 — End: 2022-11-23
  Administered 2022-11-23: 180 mg via INTRAVENOUS

## 2022-11-23 MED ORDER — HYDROMORPHONE HCL 2 MG/ML IJ SOLN
INTRAMUSCULAR | Status: AC
  Filled 2022-11-23: qty 1

## 2022-11-23 MED ORDER — CEFAZOLIN IN SODIUM CHLORIDE 3-0.9 GM/100ML-% IV SOLN
3.0000 g | Freq: Once | INTRAVENOUS | Status: AC
  Administered 2022-11-23: 3 g via INTRAVENOUS
  Filled 2022-11-23: qty 100

## 2022-11-23 MED ORDER — PHENYLEPHRINE 80 MCG/ML (10ML) SYRINGE FOR IV PUSH (FOR BLOOD PRESSURE SUPPORT)
PREFILLED_SYRINGE | INTRAVENOUS | Status: AC
  Filled 2022-11-23: qty 10

## 2022-11-23 MED ORDER — ZOLPIDEM TARTRATE 5 MG PO TABS: 5.0000 mg | ORAL_TABLET | Freq: Every evening | ORAL | Status: DC | PRN

## 2022-11-23 MED ORDER — ONDANSETRON HCL 4 MG/2ML IJ SOLN
INTRAMUSCULAR | Status: DC | PRN
Start: 2022-11-23 — End: 2022-11-23
  Administered 2022-11-23: 4 mg via INTRAVENOUS

## 2022-11-23 MED ORDER — LIDOCAINE 2% (20 MG/ML) 5 ML SYRINGE
INTRAMUSCULAR | Status: DC | PRN
  Administered 2022-11-23: 80 mg via INTRAVENOUS

## 2022-11-23 MED ORDER — HYDROMORPHONE HCL 1 MG/ML IJ SOLN
0.2500 mg | INTRAMUSCULAR | Status: DC | PRN
  Administered 2022-11-23: 0.5 mg via INTRAVENOUS
  Administered 2022-11-23 (×2): 0.25 mg via INTRAVENOUS

## 2022-11-23 MED ORDER — ROCURONIUM BROMIDE 10 MG/ML (PF) SYRINGE
PREFILLED_SYRINGE | INTRAVENOUS | Status: AC
  Filled 2022-11-23: qty 10

## 2022-11-23 MED ORDER — ONDANSETRON HCL 4 MG/2ML IJ SOLN
4.0000 mg | INTRAMUSCULAR | Status: DC | PRN
  Administered 2022-11-23 (×2): 4 mg via INTRAVENOUS
  Filled 2022-11-23 (×2): qty 2

## 2022-11-23 MED ORDER — PHENYLEPHRINE 80 MCG/ML (10ML) SYRINGE FOR IV PUSH (FOR BLOOD PRESSURE SUPPORT)
PREFILLED_SYRINGE | INTRAVENOUS | Status: DC | PRN
  Administered 2022-11-23 (×4): 160 ug via INTRAVENOUS

## 2022-11-23 MED ORDER — PROPOFOL 10 MG/ML IV BOLUS
INTRAVENOUS | Status: AC
  Filled 2022-11-23: qty 20

## 2022-11-23 MED ORDER — OXYCODONE HCL 5 MG/5ML PO SOLN: 5.0000 mg | Freq: Once | ORAL | Status: DC | PRN

## 2022-11-23 MED ORDER — BUPIVACAINE-EPINEPHRINE 0.25% -1:200000 IJ SOLN
INTRAMUSCULAR | Status: AC
  Filled 2022-11-23: qty 1

## 2022-11-23 MED ORDER — SUGAMMADEX SODIUM 500 MG/5ML IV SOLN
INTRAVENOUS | Status: DC | PRN
  Administered 2022-11-23: 200 mg via INTRAVENOUS

## 2022-11-23 MED ORDER — ROCURONIUM BROMIDE 10 MG/ML (PF) SYRINGE
PREFILLED_SYRINGE | INTRAVENOUS | Status: DC | PRN
  Administered 2022-11-23 (×2): 20 mg via INTRAVENOUS
  Administered 2022-11-23: 100 mg via INTRAVENOUS

## 2022-11-23 MED ORDER — LACTATED RINGERS IV SOLN
INTRAVENOUS | Status: DC | PRN
  Administered 2022-11-23: 1000 mL

## 2022-11-23 MED ORDER — TRIPLE ANTIBIOTIC 3.5-400-5000 EX OINT: 1.0000 | TOPICAL_OINTMENT | Freq: Three times a day (TID) | CUTANEOUS | Status: DC | PRN

## 2022-11-23 MED ORDER — STERILE WATER FOR IRRIGATION IR SOLN
Status: DC | PRN
  Administered 2022-11-23: 1000 mL

## 2022-11-23 MED ORDER — DOCUSATE SODIUM 100 MG PO CAPS
100.0000 mg | ORAL_CAPSULE | Freq: Two times a day (BID) | ORAL | Status: DC
  Administered 2022-11-23: 100 mg via ORAL
  Filled 2022-11-23: qty 1

## 2022-11-23 MED ORDER — AMISULPRIDE (ANTIEMETIC) 5 MG/2ML IV SOLN
10.0000 mg | Freq: Once | INTRAVENOUS | Status: AC | PRN
  Administered 2022-11-23: 10 mg via INTRAVENOUS

## 2022-11-23 MED ORDER — DEXAMETHASONE SODIUM PHOSPHATE 10 MG/ML IJ SOLN
INTRAMUSCULAR | Status: AC
  Filled 2022-11-23: qty 1

## 2022-11-23 MED ORDER — SODIUM CHLORIDE 0.9 % IR SOLN
Status: DC | PRN
  Administered 2022-11-23: 1000 mL via INTRAVESICAL

## 2022-11-23 MED ORDER — HYDROMORPHONE HCL 1 MG/ML IJ SOLN
INTRAMUSCULAR | Status: DC | PRN
  Administered 2022-11-23: 1 mg via INTRAVENOUS
  Administered 2022-11-23 (×2): .5 mg via INTRAVENOUS

## 2022-11-23 MED ORDER — ONDANSETRON HCL 4 MG/2ML IJ SOLN
INTRAMUSCULAR | Status: AC
  Filled 2022-11-23: qty 2

## 2022-11-23 MED ORDER — LIDOCAINE 20MG/ML (2%) 15 ML SYRINGE OPTIME
INTRAMUSCULAR | Status: DC | PRN
  Administered 2022-11-23: 1.5 mg/kg/h via INTRAVENOUS

## 2022-11-23 MED ORDER — FENTANYL CITRATE (PF) 100 MCG/2ML IJ SOLN
INTRAMUSCULAR | Status: DC | PRN
  Administered 2022-11-23: 100 ug via INTRAVENOUS

## 2022-11-23 MED ORDER — DEXMEDETOMIDINE HCL IN NACL 80 MCG/20ML IV SOLN
INTRAVENOUS | Status: DC | PRN
  Administered 2022-11-23 (×3): 4 ug via INTRAVENOUS

## 2022-11-23 MED ORDER — LOSARTAN POTASSIUM-HCTZ 100-12.5 MG PO TABS: 1.0000 | ORAL_TABLET | Freq: Every day | ORAL | Status: DC

## 2022-11-23 MED ORDER — LIDOCAINE HCL 2 % IJ SOLN
INTRAMUSCULAR | Status: AC
  Filled 2022-11-23: qty 20

## 2022-11-23 MED ORDER — LOSARTAN POTASSIUM 50 MG PO TABS
100.0000 mg | ORAL_TABLET | Freq: Every day | ORAL | Status: DC
  Administered 2022-11-23: 100 mg via ORAL
  Filled 2022-11-23: qty 2

## 2022-11-23 MED ORDER — MEPERIDINE HCL 50 MG/ML IJ SOLN: 6.2500 mg | INTRAMUSCULAR | Status: DC | PRN

## 2022-11-23 MED ORDER — BUPIVACAINE-EPINEPHRINE 0.25% -1:200000 IJ SOLN
INTRAMUSCULAR | Status: DC | PRN
  Administered 2022-11-23: 33 mL

## 2022-11-23 MED ORDER — HYDROCHLOROTHIAZIDE 12.5 MG PO TABS
12.5000 mg | ORAL_TABLET | Freq: Every day | ORAL | Status: DC
  Administered 2022-11-23: 12.5 mg via ORAL
  Filled 2022-11-23: qty 1

## 2022-11-23 MED ORDER — SULFAMETHOXAZOLE-TRIMETHOPRIM 800-160 MG PO TABS: 1.0000 | ORAL_TABLET | Freq: Two times a day (BID) | ORAL | 0 refills | Status: AC

## 2022-11-23 MED ORDER — KCL IN DEXTROSE-NACL 20-5-0.45 MEQ/L-%-% IV SOLN
INTRAVENOUS | Status: DC
  Filled 2022-11-23 (×3): qty 1000

## 2022-11-23 MED ORDER — DIPHENHYDRAMINE HCL 12.5 MG/5ML PO ELIX: 12.5000 mg | ORAL_SOLUTION | Freq: Four times a day (QID) | ORAL | Status: DC | PRN

## 2022-11-23 MED ORDER — TRAMADOL HCL 50 MG PO TABS: 50.0000 mg | ORAL_TABLET | Freq: Four times a day (QID) | ORAL | 0 refills | Status: AC | PRN

## 2022-11-23 MED ORDER — ACETAMINOPHEN 325 MG PO TABS: 650.0000 mg | ORAL_TABLET | ORAL | Status: DC | PRN

## 2022-11-23 MED ORDER — OXYCODONE HCL 5 MG PO TABS: 5.0000 mg | ORAL_TABLET | Freq: Once | ORAL | Status: DC | PRN

## 2022-11-23 MED ORDER — FENTANYL CITRATE (PF) 100 MCG/2ML IJ SOLN
INTRAMUSCULAR | Status: AC
  Filled 2022-11-23: qty 2

## 2022-11-23 MED ORDER — DEXAMETHASONE SODIUM PHOSPHATE 10 MG/ML IJ SOLN
INTRAMUSCULAR | Status: DC | PRN
  Administered 2022-11-23: 10 mg via INTRAVENOUS

## 2022-11-23 MED ORDER — CHLORHEXIDINE GLUCONATE 0.12 % MT SOLN
15.0000 mL | Freq: Once | OROMUCOSAL | Status: AC
  Administered 2022-11-23: 15 mL via OROMUCOSAL

## 2022-11-23 MED ORDER — MORPHINE SULFATE (PF) 2 MG/ML IV SOLN: 2.0000 mg | INTRAVENOUS | Status: DC | PRN

## 2022-11-23 MED ORDER — KETAMINE HCL 10 MG/ML IJ SOLN
INTRAMUSCULAR | Status: DC | PRN
  Administered 2022-11-23 (×3): 10 mg via INTRAVENOUS
  Administered 2022-11-23: 20 mg via INTRAVENOUS

## 2022-11-23 SURGICAL SUPPLY — 67 items
ADH SKN CLS APL DERMABOND .7 (GAUZE/BANDAGES/DRESSINGS) ×2
APL PRP STRL LF DISP 70% ISPRP (MISCELLANEOUS) ×2
APL SWBSTK 6 STRL LF DISP (MISCELLANEOUS) ×2
APPLICATOR COTTON TIP 6 STRL (MISCELLANEOUS) ×2 IMPLANT
APPLICATOR COTTON TIP 6IN STRL (MISCELLANEOUS) ×2
BAG COUNTER SPONGE SURGICOUNT (BAG) IMPLANT
BAG SPNG CNTER NS LX DISP (BAG)
CATH FOLEY 2WAY SLVR 18FR 30CC (CATHETERS) ×2 IMPLANT
CATH ROBINSON RED A/P 16FR (CATHETERS) ×2 IMPLANT
CATH ROBINSON RED A/P 8FR (CATHETERS) ×2 IMPLANT
CATH TIEMANN FOLEY 18FR 5CC (CATHETERS) ×2 IMPLANT
CHLORAPREP W/TINT 26 (MISCELLANEOUS) ×2 IMPLANT
CLIP LIGATING HEM O LOK PURPLE (MISCELLANEOUS) ×2 IMPLANT
COVER SURGICAL LIGHT HANDLE (MISCELLANEOUS) ×2 IMPLANT
COVER TIP SHEARS 8 DVNC (MISCELLANEOUS) ×2 IMPLANT
CUTTER ECHEON FLEX ENDO 45 340 (ENDOMECHANICALS) ×2 IMPLANT
DERMABOND ADVANCED .7 DNX12 (GAUZE/BANDAGES/DRESSINGS) ×2 IMPLANT
DRAIN CHANNEL RND F F (WOUND CARE) IMPLANT
DRAPE ARM DVNC X/XI (DISPOSABLE) ×8 IMPLANT
DRAPE COLUMN DVNC XI (DISPOSABLE) ×2 IMPLANT
DRAPE SURG IRRIG POUCH 19X23 (DRAPES) ×2 IMPLANT
DRIVER NDL LRG 8 DVNC XI (INSTRUMENTS) ×4 IMPLANT
DRIVER NDLE LRG 8 DVNC XI (INSTRUMENTS) ×4 IMPLANT
DRSG TEGADERM 4X4.75 (GAUZE/BANDAGES/DRESSINGS) ×2 IMPLANT
ELECT PENCIL ROCKER SW 15FT (MISCELLANEOUS) ×2 IMPLANT
ELECT REM PT RETURN 15FT ADLT (MISCELLANEOUS) ×2 IMPLANT
FORCEPS BPLR LNG DVNC XI (INSTRUMENTS) ×2 IMPLANT
FORCEPS PROGRASP DVNC XI (FORCEP) ×2 IMPLANT
GAUZE SPONGE 4X4 12PLY STRL (GAUZE/BANDAGES/DRESSINGS) ×2 IMPLANT
GLOVE BIO SURGEON STRL SZ 6.5 (GLOVE) ×2 IMPLANT
GLOVE SURG LX STRL 7.5 STRW (GLOVE) ×4 IMPLANT
GOWN STRL REUS W/ TWL XL LVL3 (GOWN DISPOSABLE) ×4 IMPLANT
GOWN STRL REUS W/TWL XL LVL3 (GOWN DISPOSABLE) ×4
GOWN STRL SURGICAL XL XLNG (GOWN DISPOSABLE) ×2 IMPLANT
HOLDER FOLEY CATH W/STRAP (MISCELLANEOUS) ×2 IMPLANT
IRRIG SUCT STRYKERFLOW 2 WTIP (MISCELLANEOUS) ×2
IRRIGATION SUCT STRKRFLW 2 WTP (MISCELLANEOUS) ×2 IMPLANT
IV LACTATED RINGERS 1000ML (IV SOLUTION) ×2 IMPLANT
KIT TURNOVER KIT A (KITS) IMPLANT
NDL SAFETY ECLIP 18X1.5 (MISCELLANEOUS) ×2 IMPLANT
PACK ROBOT UROLOGY CUSTOM (CUSTOM PROCEDURE TRAY) ×2 IMPLANT
PLUG CATH AND CAP STRL 200 (CATHETERS) ×2 IMPLANT
RELOAD STAPLE 45 4.1 GRN THCK (STAPLE) ×2 IMPLANT
SCISSORS LAP 5X45 EPIX DISP (ENDOMECHANICALS) IMPLANT
SCISSORS MNPLR CVD DVNC XI (INSTRUMENTS) ×2 IMPLANT
SEAL UNIV 5-12 XI (MISCELLANEOUS) ×8 IMPLANT
SET CYSTO W/LG BORE CLAMP LF (SET/KITS/TRAYS/PACK) IMPLANT
SET TUBE SMOKE EVAC HIGH FLOW (TUBING) ×2 IMPLANT
SOL ELECTROSURG ANTI STICK (MISCELLANEOUS) ×2
SOL PREP POV-IOD 4OZ 10% (MISCELLANEOUS) ×2 IMPLANT
SOLUTION ELECTROSURG ANTI STCK (MISCELLANEOUS) ×2 IMPLANT
SPIKE FLUID TRANSFER (MISCELLANEOUS) ×2 IMPLANT
STAPLE RELOAD 45 GRN (STAPLE) ×2 IMPLANT
STAPLE RELOAD 45MM GREEN (STAPLE) ×2
SUT ETHILON 3 0 PS 1 (SUTURE) ×2 IMPLANT
SUT MNCRL 3 0 RB1 (SUTURE) ×2 IMPLANT
SUT MNCRL 3 0 VIOLET RB1 (SUTURE) ×2 IMPLANT
SUT MNCRL AB 4-0 PS2 18 (SUTURE) ×4 IMPLANT
SUT PDS PLUS AB 0 CT-2 (SUTURE) ×4 IMPLANT
SUT VIC AB 0 CT1 27 (SUTURE) ×4
SUT VIC AB 0 CT1 27XBRD ANTBC (SUTURE) ×4 IMPLANT
SUT VIC AB 2-0 SH 27 (SUTURE) ×2
SUT VIC AB 2-0 SH 27X BRD (SUTURE) ×2 IMPLANT
SYR 27GX1/2 1ML LL SAFETY (SYRINGE) ×2 IMPLANT
TOWEL OR NON WOVEN STRL DISP B (DISPOSABLE) ×2 IMPLANT
TROCAR Z THREAD OPTICAL 12X100 (TROCAR) IMPLANT
WATER STERILE IRR 1000ML POUR (IV SOLUTION) ×2 IMPLANT

## 2022-11-23 NOTE — Transfer of Care (Signed)
Immediate Anesthesia Transfer of Care Note  Patient: Ralph Buck  Procedure(s) Performed: XI ROBOTIC ASSISTED LAPAROSCOPIC RADICAL PROSTATECTOMY LEVEL 3 BILATERAL PELVIC LYMPHADENECTOMY (Bilateral)  Patient Location: PACU  Anesthesia Type:General  Level of Consciousness: awake, alert , and oriented  Airway & Oxygen Therapy: Patient Spontanous Breathing and Patient connected to face mask oxygen  Post-op Assessment: Report given to RN and Post -op Vital signs reviewed and stable  Post vital signs: Reviewed and stable  Last Vitals:  Vitals Value Taken Time  BP 127/85 11/23/22 1432  Temp    Pulse 82 11/23/22 1435  Resp 21 11/23/22 1435  SpO2 100 % 11/23/22 1435  Vitals shown include unfiled device data.  Last Pain:  Vitals:   11/23/22 1009  TempSrc: Oral  PainSc:       Patients Stated Pain Goal: 3 (11/23/22 1006)  Complications: No notable events documented.

## 2022-11-23 NOTE — Progress Notes (Signed)
Pt ambulated 500 ft on hallway without any assistant at this time, tolerated well.

## 2022-11-23 NOTE — Progress Notes (Signed)
Patient ambulated around 30 feet in hallway using RW- tolerated well. Pt aware of need to ambulate one more time this evening. Pain under control at this time- pt continues to have nausea but Zofran did help. Pt educated on IS use, will continue to encourage.

## 2022-11-23 NOTE — Interval H&P Note (Signed)
History and Physical Interval Note:  11/23/2022 10:00 AM  Ralph Buck  has presented today for surgery, with the diagnosis of PROSTATE CANCER.  The various methods of treatment have been discussed with the patient and family. After consideration of risks, benefits and other options for treatment, the patient has consented to  Procedure(s) with comments: XI ROBOTIC ASSISTED LAPAROSCOPIC RADICAL PROSTATECTOMY LEVEL 3 (N/A) - 210 MINUTES NEEDED FOR CASE BILATERAL PELVIC LYMPHADENECTOMY, POSSIBLE OPEN (Bilateral) as a surgical intervention.  The patient's history has been reviewed, patient examined, no change in status, stable for surgery.  I have reviewed the patient's chart and labs.  Questions were answered to the patient's satisfaction.     [employer]

## 2022-11-23 NOTE — Progress Notes (Signed)
Patient ID: Ralph Buck, male   DOB: February 20, 1961, 62 y.o.   MRN: [id number]  Post-op note  Subjective: The patient is doing well.  No complaints.  Objective: Vital signs in last 24 hours: Temp:  [97.4 F (36.3 C)-98.6 F (37 C)] 97.4 F (36.3 C) (08/05 1615) Pulse Rate:  [76-91] 90 (08/05 1615) Resp:  [12-19] 13 (08/05 1615) BP: (127-141)/(73-87) 133/79 (08/05 1615) SpO2:  [95 %-100 %] 97 % (08/05 1615)  Intake/Output from previous day: No intake/output data recorded. Intake/Output this shift: Total I/O In: 1900 [I.V.:1900] Out: 165 [Drains:65; Blood:100]  Physical Exam:  General: Alert and oriented. Abdomen: Soft, Nondistended. Incisions: Clean and dry. GU: Urine clearing.  Lab Results: Recent Labs    11/23/22 1448  HGB 13.5  HCT 38.4*    Assessment/Plan: POD#0   1) Continue to monitor, ambulate, IS   Moody Bruins. MD   LOS: 0 days   Crecencio Mc 11/23/2022, 4:33 PM

## 2022-11-23 NOTE — Discharge Instructions (Signed)

## 2022-11-23 NOTE — Op Note (Signed)
Preoperative diagnosis: Clinically localized adenocarcinoma of the prostate (clinical stage T1c Nx Mx)  Postoperative diagnosis: Clinically localized adenocarcinoma of the prostate (clinical stage T1c nx Mx)  Procedure:  Robotic assisted laparoscopic radical prostatectomy (bilateral nerve sparing) Bilateral robotic assisted laparoscopic pelvic lymphadenectomy  Surgeon: Moody Bruins. M.D.  Assistant: Harrie Foreman, PA-C  An assistant was required for this surgical procedure.  The duties of the assistant included but were not limited to suctioning, passing suture, camera manipulation, retraction. This procedure would not be able to be performed without an Geophysicist/field seismologist.  Anesthesia: General  Complications: None  EBL: 100 mL  IVF:  1500 mL crystalloid  Specimens: Prostate and seminal vesicles Right pelvic lymph nodes Left pelvic lymph nodes  Disposition of specimens: Pathology  Drains: 20 Fr coude catheter # 19 Blake pelvic drain  Indication: Ralph Buck is a 62 y.o. year old patient with clinically localized prostate cancer.  After a thorough review of the management options for treatment of prostate cancer, he elected to proceed with surgical therapy and the above procedure(s).  We have discussed the potential benefits and risks of the procedure, side effects of the proposed treatment, the likelihood of the patient achieving the goals of the procedure, and any potential problems that might occur during the procedure or recuperation. Informed consent has been obtained.  Description of procedure:  The patient was taken to the operating room and a general anesthetic was administered. He was given preoperative antibiotics, placed in the dorsal lithotomy position, and prepped and draped in the usual sterile fashion. Next a preoperative timeout was performed. A urethral catheter was placed into the bladder and a site was selected near the umbilicus for placement of the camera  port. This was placed using a standard open Hassan technique which allowed entry into the peritoneal cavity under direct vision and without difficulty. An 8 mm robotic port was placed and a pneumoperitoneum established.  He had undergone a prior midline laparotomy in the past and omental adhesions could be palpated but space was able to be created.  The camera was then used to inspect the abdomen and there was no evidence of any intra-abdominal injuries or other abnormalities aside from extensive omental adhesions along the mid and upper abdomen.  A window was identified on the left side of the abdomen and an 8 mm left sided port was able to be placed along with a far left abdominal 8 mm port.  Laparoscopic scissors were then used to take town the adhesions between the abdominal wall and omentum. The remaining abdominal ports were then placed. An 8 mm robotic ports were placed in the right lower quadrant. A 5 mm port was placed in the right upper quadrant and a 12 mm port was placed in the right lateral abdominal wall for laparoscopic assistance. All ports were placed under direct vision without difficulty. The surgical cart was then docked.   Utilizing the cautery scissors, the bladder was reflected posteriorly allowing entry into the space of Retzius and identification of the endopelvic fascia and prostate. The periprostatic fat was then removed from the prostate allowing full exposure of the endopelvic fascia. The endopelvic fascia was then incised from the apex back to the base of the prostate bilaterally and the underlying levator muscle fibers were swept laterally off the prostate thereby isolating the dorsal venous complex. The dorsal vein was then stapled and divided with a 45 mm Flex Echelon stapler. Attention then turned to the bladder neck which was divided  anteriorly thereby allowing entry into the bladder and exposure of the urethral catheter. The catheter balloon was deflated and the catheter was  brought into the operative field and used to retract the prostate anteriorly. The posterior bladder neck was then examined and was divided allowing further dissection between the bladder and prostate posteriorly until the vasa deferentia and seminal vessels were identified. The vasa deferentia were isolated, divided, and lifted anteriorly. The seminal vesicles were dissected down to their tips with care to control the seminal vascular arterial blood supply. These structures were then lifted anteriorly and the space between Denonvillier's fascia and the anterior rectum was developed with a combination of sharp and blunt dissection. This isolated the vascular pedicles of the prostate.  The lateral prostatic fascia was then sharply incised allowing release of the neurovascular bundles bilaterally. The vascular pedicles of the prostate were then ligated with Weck clips between the prostate and neurovascular bundles and divided with sharp cold scissor dissection resulting in neurovascular bundle preservation. The neurovascular bundles were then separated off the apex of the prostate and urethra bilaterally.  The urethra was then sharply transected allowing the prostate specimen to be disarticulated. The pelvis was copiously irrigated and hemostasis was ensured. There was no evidence for rectal injury.  Attention then turned to the right pelvic sidewall. The fibrofatty tissue between the external iliac vein, confluence of the iliac vessels, hypogastric artery, and Cooper's ligament was dissected free from the pelvic sidewall with care to preserve the obturator nerve. Weck clips were used for lymphostasis and hemostasis. An identical procedure was performed on the contralateral side and the lymphatic packets were removed for permanent pathologic analysis.  Attention then turned to the urethral anastomosis. A 2-0 Vicryl slip knot was placed between Denonvillier's fascia, the posterior bladder neck, and the posterior  urethra to reapproximate these structures. A double-armed 3-0 Monocryl suture was then used to perform a 360 running tension-free anastomosis between the bladder neck and urethra. A new urethral catheter was then placed into the bladder and irrigated. There were no blood clots within the bladder and the anastomosis appeared to be watertight. A #19 Blake drain was then brought through the left lateral 8 mm port site and positioned appropriately within the pelvis. It was secured to the skin with a nylon suture. The surgical cart was then undocked. The right lateral 12 mm port site was closed at the fascial level with a 0 Vicryl suture placed laparoscopically. All remaining ports were then removed under direct vision. The prostate specimen was removed intact within the Endopouch retrieval bag via the periumbilical camera port site. This fascial opening was closed with two running 0 PDS sutures. 0.25% Marcaine was then injected into all port sites and all incisions were reapproximated at the skin level with 4-0 Monocryl subcuticular sutures and Dermabond. The patient appeared to tolerate the procedure well and without complications. The patient was able to be extubated and transferred to the recovery unit in satisfactory condition.   Moody Bruins MD

## 2022-11-23 NOTE — Anesthesia Preprocedure Evaluation (Signed)
Anesthesia Evaluation  Patient identified by MRN, date of birth, ID band Patient awake    Reviewed: Allergy & Precautions, H&P , NPO status , Patient's Chart, lab work & pertinent test results  Airway Mallampati: II  TM Distance: >3 FB Neck ROM: Full    Dental no notable dental hx.    Pulmonary neg pulmonary ROS   Pulmonary exam normal breath sounds clear to auscultation       Cardiovascular hypertension, Pt. on medications negative cardio ROS Normal cardiovascular exam Rhythm:Regular Rate:Normal     Neuro/Psych negative neurological ROS  negative psych ROS   GI/Hepatic negative GI ROS, Neg liver ROS,,,  Endo/Other  negative endocrine ROS    Renal/GU negative Renal ROS  negative genitourinary   Musculoskeletal negative musculoskeletal ROS (+)    Abdominal  (+) + obese  Peds negative pediatric ROS (+)  Hematology negative hematology ROS (+)   Anesthesia Other Findings Prostate Cancer  Reproductive/Obstetrics negative OB ROS                             Anesthesia Physical Anesthesia Plan  ASA: 3  Anesthesia Plan: General   Post-op Pain Management: Dilaudid IV   Induction: Intravenous  PONV Risk Score and Plan: 2 and Ondansetron, Midazolam and Treatment may vary due to age or medical condition  Airway Management Planned: Oral ETT  Additional Equipment:   Intra-op Plan:   Post-operative Plan: Extubation in OR  Informed Consent: I have reviewed the patients History and Physical, chart, labs and discussed the procedure including the risks, benefits and alternatives for the proposed anesthesia with the patient or authorized representative who has indicated his/her understanding and acceptance.     Dental advisory given  Plan Discussed with: CRNA  Anesthesia Plan Comments:        Anesthesia Quick Evaluation

## 2022-11-23 NOTE — Anesthesia Procedure Notes (Signed)
Procedure Name: Intubation Date/Time: 11/23/2022 10:58 AM  Performed by: ,  D, CRNAPre-anesthesia Checklist: Patient identified, Emergency Drugs available, Suction available and Patient being monitored Patient Re-evaluated:Patient Re-evaluated prior to induction Oxygen Delivery Method: Circle system utilized Preoxygenation: Pre-oxygenation with 100% oxygen Induction Type: IV induction Ventilation: Mask ventilation without difficulty Laryngoscope Size: Mac and 4 Grade View: Grade II Tube type: Oral Tube size: 7.5 mm Number of attempts: 1 Airway Equipment and Method: Stylet and Oral airway Placement Confirmation: ETT inserted through vocal cords under direct vision, positive ETCO2 and breath sounds checked- equal and bilateral Secured at: 22 cm Tube secured with: Tape Dental Injury: Teeth and Oropharynx as per pre-operative assessment

## 2022-11-24 ENCOUNTER — Encounter (HOSPITAL_COMMUNITY): Payer: Self-pay | Admitting: Urology

## 2022-11-24 DIAGNOSIS — C61 Malignant neoplasm of prostate: Secondary | ICD-10-CM | POA: Diagnosis not present

## 2022-11-24 MED ORDER — BISACODYL 10 MG RE SUPP
10.0000 mg | Freq: Once | RECTAL | Status: AC
  Administered 2022-11-24: 10 mg via RECTAL
  Filled 2022-11-24: qty 1

## 2022-11-24 MED ORDER — LIDOCAINE 0.5 % EX GEL: 20.0000 g | Freq: Four times a day (QID) | CUTANEOUS | 0 refills | Status: AC | PRN

## 2022-11-24 MED ORDER — TRAMADOL HCL 50 MG PO TABS: 50.0000 mg | ORAL_TABLET | Freq: Four times a day (QID) | ORAL | Status: DC | PRN

## 2022-11-24 NOTE — Progress Notes (Signed)
No d/c need or orders.

## 2022-11-24 NOTE — TOC Transition Note (Signed)
Transition of Care [facility]) - CM/SW Discharge Note   Patient Details  Name: Ralph Buck MRN: [id number] Date of Birth: 1960/10/02  Transition of Care [facility]) CM/SW Contact:  Lanier Clam, RN Phone Number: 11/24/2022, 9:21 AM   Clinical Narrative:       Final next level of care: Home/Self Care Barriers to Discharge: No Barriers Identified   Patient Goals and CMS Choice   Choice offered to / list presented to : NA  Discharge Placement                         Discharge Plan and Services Additional resources added to the After Visit Summary for     Discharge Planning Services: CM Consult Post Acute Care Choice: NA                               Social Determinants of Health (SDOH) Interventions SDOH Screenings   Food Insecurity: No Food Insecurity (11/23/2022)  Housing: Low Risk  (11/23/2022)  Transportation Needs: No Transportation Needs (11/23/2022)  Utilities: Not At Risk (11/23/2022)  Tobacco Use: Low Risk  (11/23/2022)     Readmission Risk Interventions     No data to display

## 2022-11-24 NOTE — Progress Notes (Signed)
Patient ID: Ralph Buck, male   DOB: 1960/09/25, 62 y.o.   MRN: [id number]  1 Day Post-Op Subjective: The patient is doing well.  No nausea or vomiting. Pain is adequately controlled.  Objective: Vital signs in last 24 hours: Temp:  [97.4 F (36.3 C)-98.7 F (37.1 C)] 98.7 F (37.1 C) (08/06 0602) Pulse Rate:  [76-97] 92 (08/06 0602) Resp:  [12-19] 16 (08/06 0602) BP: (109-144)/(63-87) 109/63 (08/06 0602) SpO2:  [95 %-100 %] 98 % (08/06 0602) Weight:  [117.5 kg] 117.5 kg (08/05 1645)  Intake/Output from previous day: 08/05 0701 - 08/06 0700 In: 3699.9 [I.V.:3699.9] Out: 1160 [Urine:900; Drains:160; Blood:100] Intake/Output this shift: No intake/output data recorded.  Physical Exam:  General: Alert and oriented. CV: RRR Lungs: Clear bilaterally. GI: Soft, Nondistended. Incisions: Clean, dry, and intact Urine: Clear Extremities: Nontender, no erythema, no edema.  Lab Results: Recent Labs    11/23/22 1448 11/24/22 0500  HGB 13.5 13.1  HCT 38.4* 38.4*      Assessment/Plan: POD# 1 s/p robotic prostatectomy.  1) SL IVF 2) Ambulate, Incentive spirometry 3) Transition to oral pain medication 4) Dulcolax suppository 5) D/C pelvic drain 6) Plan for likely discharge later today   Moody Bruins. MD   LOS: 0 days   Crecencio Mc 11/24/2022, 7:31 AM

## 2022-11-24 NOTE — Discharge Summary (Signed)
Date of admission: 11/23/2022  Date of discharge: 11/24/2022  Admission diagnosis: Prostate cancer  Discharge diagnosis: same  Secondary diagnoses:  Patient Active Problem List   Diagnosis Date Noted   Prostate cancer (HCC) 11/23/2022   Malignant neoplasm of prostate (HCC) 08/28/2022   Genetic testing 03/04/2020   Family history of leukemia    Family history of prostate cancer    Family history of throat cancer    DVT of lower extremity (deep venous thrombosis) (HCC) 11/07/2013   Varicose veins of lower extremities with other complications 10/02/2013   Symptomatic varicose veins of both lower extremities 09/19/2013   Breast cancer of lower-outer quadrant of right male breast (HCC) 04/12/2012   Osteoporosis, idiopathic 04/12/2012    Procedures performed: Procedure(s): XI ROBOTIC ASSISTED LAPAROSCOPIC RADICAL PROSTATECTOMY LEVEL 3 BILATERAL PELVIC LYMPHADENECTOMY  History and Physical: For full details, please see admission history and physical. Briefly, Ralph Buck is a 62 y.o. year old patient with prostate cancer who underwent radical prostatectomy on 8/5.   Hospital Course: Patient tolerated the procedure well.  He was then transferred to the floor after an uneventful PACU stay.  His hospital course was uncomplicated.  On POD#1 he had met discharge criteria: was eating a regular diet, was up and ambulating independently,  pain was well controlled, was voiding with a catheter, and was ready to for discharge. Plan to remove catheter next week. Drain was removed prior to discharge.    Laboratory values:  Recent Labs    11/23/22 1448 11/24/22 0500  HGB 13.5 13.1  HCT 38.4* 38.4*   No results for input(s): "NA", "K", "CL", "CO2", "GLUCOSE", "BUN", "CREATININE", "CALCIUM" in the last 72 hours. No results for input(s): "LABPT", "INR" in the last 72 hours. No results for input(s): "LABURIN" in the last 72 hours. Results for orders placed or performed during the hospital  encounter of 12/03/09  Surgical pcr screen     Status: None   Collection Time: 12/03/09 10:15 AM  Result Value Ref Range Status   MRSA, PCR NEGATIVE NEGATIVE Final   Staphylococcus aureus  NEGATIVE Final    NEGATIVE        The Xpert SA Assay (FDA approved for NASAL specimens only), is one component of a comprehensive surveillance program.  It is not intended to diagnose infection nor to guide or monitor treatment.    Physical Exam  Gen: NAD Resp: Satting well on RA Card: Regular rate Abd: Soft, appropriately tender, ND, incision clean dry and intact GU: Voing spontaneously  Foley catheter in place draining urine. Neuro: Alert   Disposition: Home  Discharge instruction: The patient was instructed to be ambulatory but told to refrain from heavy lifting, strenuous activity, or driving.   Discharge medications:  Allergies as of 11/24/2022       Reactions   Amoxicillin Hives, Itching   Penicillins Hives, Itching        Medication List     STOP taking these medications    Multi Vitamin Tabs   Vitamin D 50 MCG (2000 UT) tablet       TAKE these medications    docusate sodium 100 MG capsule Commonly known as: COLACE Take 1 capsule (100 mg total) by mouth 2 (two) times daily.   Lidocaine 0.5 % Gel Apply 20 g topically 4 (four) times daily as needed. Apply to tip of penis while catheter is in place.   losartan-hydrochlorothiazide 100-12.5 MG tablet Commonly known as: HYZAAR Take 1 tablet by mouth daily.  sulfamethoxazole-trimethoprim 800-160 MG tablet Commonly known as: BACTRIM DS Take 1 tablet by mouth 2 (two) times daily. Start the day prior to foley removal appointment   traMADol 50 MG tablet Commonly known as: Ultram Take 1-2 tablets (50-100 mg total) by mouth every 6 (six) hours as needed for moderate pain or severe pain.        Followup:   Follow-up Information     Heloise Purpura, MD Follow up on 12/01/2022.   Specialty: Urology Why: at  1:30 Contact information: [address] [place] [address] [phone]

## 2022-11-24 NOTE — Progress Notes (Signed)
JP drain removed without complications. Dulcolax supp given, pt states he is already passing gas. Clear liquid tray ordered- pt has not had any clear liquids since surgery besides a few sips of Sprite d/t nausea. Denies nausea and pain at this time. Foley teaching done with wife at bedside- extra supplies provided for home, including leg bag. All questions answered. Pt requesting to ambulate in hall with wife assistance. Will continue to monitor.

## 2022-11-24 NOTE — Anesthesia Postprocedure Evaluation (Signed)
Anesthesia Post Note  Patient: Ralph Buck  Procedure(s) Performed: XI ROBOTIC ASSISTED LAPAROSCOPIC RADICAL PROSTATECTOMY LEVEL 3 BILATERAL PELVIC LYMPHADENECTOMY (Bilateral)     Patient location during evaluation: PACU Anesthesia Type: General Level of consciousness: awake and alert Pain management: pain level controlled Vital Signs Assessment: post-procedure vital signs reviewed and stable Respiratory status: spontaneous breathing, nonlabored ventilation and respiratory function stable Cardiovascular status: blood pressure returned to baseline and stable Postop Assessment: no apparent nausea or vomiting Anesthetic complications: no   No notable events documented.  Last Vitals:  Vitals:   11/24/22 0052 11/24/22 0602  BP: 132/77 109/63  Pulse: 97 92  Resp: 16 16  Temp: 37 C 37.1 C  SpO2: 97% 98%    Last Pain:  Vitals:   11/24/22 0602  TempSrc: Oral  PainSc:                  Lowella Curb

## 2022-11-29 LAB — SURGICAL PATHOLOGY

## 2022-11-30 NOTE — Progress Notes (Signed)
RN left voicemail with patient to follow up since recent robotic prostatectomy.  Direct dial left for any questions or concerns.

## 2023-07-06 ENCOUNTER — Telehealth: Payer: Self-pay | Admitting: Genetic Counselor

## 2023-07-09 ENCOUNTER — Telehealth: Payer: Self-pay | Admitting: Genetic Counselor

## 2023-08-18 ENCOUNTER — Telehealth: Payer: Self-pay | Admitting: Genetic Counselor

## 2023-08-18 NOTE — Telephone Encounter (Signed)
 Called Mr. Ralph Buck because he had genetic counseling appt scheduled for 5/5.  He had genetic testing for 58 heredtiary cancer genes (+RNA analysis) in 2021, which was negative.  Told Mr. Ralph Buck that no updated genetic testing is needed at this time, as his testing in 2021 was comprehensive and there haven't been many updates to testing.  Discussed relatives still have a slighly increased risk for cancer, given family history alone.  Also encouraged his relatives (especially daughters) speak with their providers about breast cancer risk models to inform their screening.  Encouraged him to stay in touch to learn of any genetics-related updates that may benefiit him/his family.  Patient requested secure email sent with report/information discussed.  Mr. Ralph Buck said it was okay to cancel genetics appt scheduled for 5/5, as all of his questions were answered.

## 2023-08-23 ENCOUNTER — Encounter: Admitting: Genetic Counselor

## 2023-08-23 ENCOUNTER — Other Ambulatory Visit
